# Patient Record
Sex: Female | Born: 1950
Health system: Southern US, Community
[De-identification: ages and names within clinical notes are randomized; demographics above are authoritative.]

## PROBLEM LIST (undated history)

## (undated) DIAGNOSIS — Z8719 Personal history of other diseases of the digestive system: Secondary | ICD-10-CM

## (undated) DIAGNOSIS — K5790 Diverticulosis of intestine, part unspecified, without perforation or abscess without bleeding: Secondary | ICD-10-CM

## (undated) DIAGNOSIS — I10 Essential (primary) hypertension: Secondary | ICD-10-CM

## (undated) DIAGNOSIS — M1990 Unspecified osteoarthritis, unspecified site: Secondary | ICD-10-CM

## (undated) DIAGNOSIS — R0602 Shortness of breath: Secondary | ICD-10-CM

## (undated) HISTORY — PX: TONSILLECTOMY: SUR1361

## (undated) HISTORY — PX: HERNIA REPAIR: SHX51

## (undated) HISTORY — PX: JOINT REPLACEMENT: SHX530

## (undated) HISTORY — PX: TONSILLECTOMY: SHX5217

## (undated) HISTORY — PX: MOUTH SURGERY: SHX715

## (undated) HISTORY — DX: Personal history of other diseases of the digestive system: Z87.19

## (undated) HISTORY — DX: Diverticulosis of intestine, part unspecified, without perforation or abscess without bleeding: K57.90

## (undated) HISTORY — DX: Essential (primary) hypertension: I10

---
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
## 2001-09-16 ENCOUNTER — Other Ambulatory Visit: Admission: RE | Admit: 2001-09-16 | Discharge: 2001-09-16 | Payer: Self-pay | Admitting: Obstetrics and Gynecology

## 2002-09-23 ENCOUNTER — Other Ambulatory Visit: Admission: RE | Admit: 2002-09-23 | Discharge: 2002-09-23 | Payer: Self-pay | Admitting: Obstetrics and Gynecology

## 2008-04-17 ENCOUNTER — Emergency Department (HOSPITAL_COMMUNITY): Admission: EM | Admit: 2008-04-17 | Discharge: 2008-04-17 | Payer: Self-pay | Admitting: Family Medicine

## 2008-04-20 ENCOUNTER — Emergency Department (HOSPITAL_COMMUNITY): Admission: EM | Admit: 2008-04-20 | Discharge: 2008-04-20 | Payer: Self-pay | Admitting: Emergency Medicine

## 2008-04-20 ENCOUNTER — Encounter: Admission: RE | Admit: 2008-04-20 | Discharge: 2008-04-20 | Payer: Self-pay | Admitting: Emergency Medicine

## 2010-04-26 LAB — POCT URINALYSIS DIP (DEVICE)
Bilirubin Urine: NEGATIVE
Ketones, ur: NEGATIVE mg/dL
Protein, ur: NEGATIVE mg/dL
Specific Gravity, Urine: 1.01 (ref 1.005–1.030)

## 2010-05-17 ENCOUNTER — Inpatient Hospital Stay (INDEPENDENT_AMBULATORY_CARE_PROVIDER_SITE_OTHER)
Admission: RE | Admit: 2010-05-17 | Discharge: 2010-05-17 | Disposition: A | Discharge status: Home / Self Care | Payer: BC Managed Care – PPO | Source: Ambulatory Visit | Attending: Family Medicine | Admitting: Family Medicine

## 2010-05-17 DIAGNOSIS — L0291 Cutaneous abscess, unspecified: Secondary | ICD-10-CM

## 2010-05-20 ENCOUNTER — Inpatient Hospital Stay (INDEPENDENT_AMBULATORY_CARE_PROVIDER_SITE_OTHER)
Admission: RE | Admit: 2010-05-20 | Discharge: 2010-05-20 | Disposition: A | Discharge status: Home / Self Care | Payer: BC Managed Care – PPO | Source: Ambulatory Visit | Attending: Family Medicine | Admitting: Family Medicine

## 2010-05-20 DIAGNOSIS — L0291 Cutaneous abscess, unspecified: Secondary | ICD-10-CM

## 2010-05-20 DIAGNOSIS — L0390 Cellulitis, unspecified: Secondary | ICD-10-CM

## 2012-04-21 ENCOUNTER — Emergency Department (HOSPITAL_COMMUNITY)
Admission: EM | Admit: 2012-04-21 | Discharge: 2012-04-21 | Disposition: A | Discharge status: Home / Self Care | Payer: BC Managed Care – PPO | Source: Home / Self Care | Attending: Emergency Medicine | Admitting: Emergency Medicine

## 2012-04-21 ENCOUNTER — Encounter (HOSPITAL_COMMUNITY): Payer: Self-pay | Admitting: Emergency Medicine

## 2012-04-21 DIAGNOSIS — N939 Abnormal uterine and vaginal bleeding, unspecified: Secondary | ICD-10-CM

## 2012-04-21 DIAGNOSIS — R030 Elevated blood-pressure reading, without diagnosis of hypertension: Secondary | ICD-10-CM

## 2012-04-21 DIAGNOSIS — N926 Irregular menstruation, unspecified: Secondary | ICD-10-CM

## 2012-04-21 LAB — POCT URINALYSIS DIP (DEVICE)
Bilirubin Urine: NEGATIVE
Glucose, UA: NEGATIVE mg/dL
Ketones, ur: NEGATIVE mg/dL
Specific Gravity, Urine: 1.015 (ref 1.005–1.030)
Urobilinogen, UA: 0.2 mg/dL (ref 0.0–1.0)

## 2012-04-21 NOTE — ED Notes (Signed)
Pt c/o vaginal bleeding on and off for the past 3 weeks. Denies discharge, fever, or pelvic pain. Mild abdominal cramping. Has tried Tylenol extra strength with no relief. Has to wear a pad due to heavy bleeding. Patient is alert and oriented.

## 2012-04-21 NOTE — ED Provider Notes (Signed)
Medical screening examination/treatment/procedure(s) were performed by non-physician practitioner and as supervising physician I was immediately available for consultation/collaboration.  Cassie Nelson   Cornella Emmer, MD 04/21/12 1248 

## 2012-04-21 NOTE — ED Provider Notes (Signed)
History     CSN: [id number]  Arrival date & time 04/21/12  1027   First MD Initiated Contact with Patient 04/21/12 1228      Chief Complaint  Patient presents with  . Vaginal Bleeding    (Consider location/radiation/quality/duration/timing/severity/associated sxs/prior treatment) HPI Comments: Pt went through menopause 10 years ago, no vaginal bleeding since until 3 weeks ago.  Bleeding is on and off, sometimes spotting, sometimes bright red bleeding that is heavier.    Patient is a 62 y.o. female presenting with vaginal bleeding. The history is provided by the patient.  Vaginal Bleeding This is a new problem. Episode onset: 3 weeks ago. The problem occurs daily. The problem has not changed since onset.Associated symptoms include abdominal pain. Associated symptoms comments: Mild low abd cramping at times. Nothing aggravates the symptoms. Nothing relieves the symptoms. She has tried nothing for the symptoms.    History reviewed. No pertinent past medical history.  History reviewed. No pertinent past surgical history.  History reviewed. No pertinent family history.  History  Substance Use Topics  . Smoking status: Former Smoker    Quit date: 06/22/2010  . Smokeless tobacco: Not on file  . Alcohol Use: Yes     Comment: occasionally    OB History   Grav Para Term Preterm Abortions TAB SAB Ect Mult Living                  Review of Systems  Constitutional: Negative for fever and chills.  Gastrointestinal: Positive for abdominal pain. Negative for nausea and vomiting.       Mild low abd cramping at times  Genitourinary: Positive for vaginal bleeding. Negative for dysuria, frequency, flank pain, vaginal discharge and vaginal pain.    Allergies  Review of patient's allergies indicates no known allergies.  Home Medications  No current outpatient prescriptions on file.  BP 188/62  Pulse 91  Temp(Src) 98 F (36.7 C) (Oral)  SpO2 95%  Physical Exam  Constitutional:  She appears well-developed and well-nourished. No distress.  Morbid obesity  Cardiovascular: Normal rate and regular rhythm.   Pulmonary/Chest: Effort normal and breath sounds normal.  Abdominal: Soft. Bowel sounds are normal. She exhibits no distension. There is no tenderness. There is no rebound, no guarding and no CVA tenderness.  Genitourinary: Cervix exhibits no motion tenderness and no friability.    Unable to palpate uterus or ovaries due to body habitus.     ED Course  Procedures (including critical care time)  Labs Reviewed  POCT URINALYSIS DIP (DEVICE) - Abnormal; Notable for the following:    Hgb urine dipstick LARGE (*)    Leukocytes, UA TRACE (*)    All other components within normal limits   No results found.   1. Abnormal uterine bleeding   2. Elevated blood pressure reading without diagnosis of hypertension       MDM  Dr. Ladon Applebaum examined pt with me.  Abnormal uterine bleeding in post-menopausal woman.  Referred to Dr. Erin Fulling (Gyn) for further eval.  Also elevated bp today. Pt to find pcp and f/u about bp. In the meantime pt is to measure and record bp on her own.          Cathlyn Parsons, NP 04/21/12 1240

## 2012-05-14 ENCOUNTER — Other Ambulatory Visit (HOSPITAL_COMMUNITY)
Admission: RE | Admit: 2012-05-14 | Discharge: 2012-05-14 | Disposition: A | Discharge status: Home / Self Care | Payer: BC Managed Care – PPO | Source: Ambulatory Visit | Attending: Obstetrics & Gynecology | Admitting: Obstetrics & Gynecology

## 2012-05-14 ENCOUNTER — Encounter: Payer: Self-pay | Admitting: Obstetrics & Gynecology

## 2012-05-14 ENCOUNTER — Ambulatory Visit (INDEPENDENT_AMBULATORY_CARE_PROVIDER_SITE_OTHER): Payer: BC Managed Care – PPO | Admitting: Obstetrics & Gynecology

## 2012-05-14 VITALS — BP 191/83 | HR 90 | Temp 97.3°F | Ht 68.0 in | Wt 323.3 lb

## 2012-05-14 DIAGNOSIS — N950 Postmenopausal bleeding: Secondary | ICD-10-CM | POA: Insufficient documentation

## 2012-05-14 DIAGNOSIS — I10 Essential (primary) hypertension: Secondary | ICD-10-CM | POA: Insufficient documentation

## 2012-05-14 DIAGNOSIS — R87619 Unspecified abnormal cytological findings in specimens from cervix uteri: Secondary | ICD-10-CM | POA: Insufficient documentation

## 2012-05-14 DIAGNOSIS — N888 Other specified noninflammatory disorders of cervix uteri: Secondary | ICD-10-CM | POA: Insufficient documentation

## 2012-05-14 MED ORDER — LISINOPRIL-HYDROCHLOROTHIAZIDE 20-12.5 MG PO TABS: 1.0000 | ORAL_TABLET | Freq: Every day | ORAL | Status: DC

## 2012-05-14 NOTE — Progress Notes (Signed)
Subjective:     Patient ID: Cassie Nelson, female   DOB: 11-22-1950, 62 y.o.   MRN: [id number]  HPI Pt c/o heavy bleeding 1 months ago for 4 weeks.  Her LMP was 10 years prev.  She has had no bleeding now for ~3weeks.  She denies pain.   She was previously told that she had elevated BP but, has never been treated and has not seen a primary care physician in 'many years'  Last PAP >5 years previously.  No currently sexually active   Past Medical History  Diagnosis Date  . Diverticular disease    Past Surgical History  Procedure Laterality Date  . Tonsillectomy    . Hernia repair     No current outpatient prescriptions on file prior to visit.   No current facility-administered medications on file prior to visit.  No Known Allergies History   Social History  . Marital Status: Unknown    Spouse Name: N/A    Number of Children: N/A  . Years of Education: N/A   Occupational History  . Not on file.   Social History Main Topics  . Smoking status: Former Smoker    Quit date: 06/22/2010  . Smokeless tobacco: Not on file  . Alcohol Use: Yes     Comment: occasionally  . Drug Use: No  . Sexually Active: Not Currently   Other Topics Concern  . Not on file   Social History Narrative  . No narrative on file   Family History  Problem Relation Age of Onset  . Heart disease Father   . Cancer Father        Review of Systems     Objective:   Physical Exam BP 191/83  Pulse 90  Temp(Src) 97.3 F (36.3 C) (Oral)  Ht 5\' 8"  (1.727 m)  Wt 323 lb 4.8 oz (146.648 kg)  BMI 49.17 kg/m2 Pt in NAD.  Teary at times during exam Lungs: CTA CV: RRR Abd: obese, NT, ND GU: EGBUS: no lesions Vagina: no blood in vault Cervix: no lesion; no mucopurulent d/c Uterus: difficult to assess due to body habitus The indications for endometrial biopsy were reviewed.   Risks of the biopsy including cramping, bleeding, infection, uterine perforation, inadequate specimen and need for additional  procedures  were discussed. The patient states she understands and agrees to undergo procedure today. Consent was signed. Time out was performed. Urine HCG was negative. A sterile speculum was placed in the patient's vagina and the cervix was prepped with Betadine. A single-toothed tenaculum was placed on the anterior lip of the cervix to stabilize it. The 3 mm pipelle was introduced into the endometrial cavity without difficulty to a depth of 9cm, and a moderate amount of tissue was obtained and sent to pathology. The instruments were removed from the patient's vagina. Minimal bleeding from the cervix was noted. The patient tolerated the procedure well. Routine post-procedure instructions were given to the patient. The patient will follow up to review the results and for further management.   Adnexa: no masses difficult to palpate due to body habitus         Assessment:     PMPB- need to r/o endometrial ca Elevated BP- reviewed with pt the risks of stroke and the IMPORTANCE of getting further tx      Plan:     Zestoretic 20/12.5 po q day F/u primary care- referral given F/u surg path Pelvic sono F/u PAP

## 2012-05-14 NOTE — Patient Instructions (Addendum)
Postmenopausal Bleeding Menopause is commonly referred to as the "change in life." It is a time when the fertile years, the time of ovulating and having menstrual periods, has come to an end. It is also determined by not having menstrual periods for 12 months.  Postmenopausal bleeding is any bleeding a woman has after she has entered into menopause. Any type of postmenopausal bleeding, even if it appears to be a typical menstrual period, is concerning. This should be evaluated by your caregiver.  CAUSES   Hormone therapy.  Cancer of the cervix or cancer of the lining of the uterus (endometrial cancer).  Thinning of the uterine lining (uterine atrophy).  Thyroid diseases.  Certain medicines.  Infection of the uterus or cervix.  Inflammation or irritation of the uterine lining (endometritis).  Estrogen-secreting tumors.  Growths (polyps) on the cervix, uterine lining, or uterus.  Uterine tumors (fibroids).  Being very overweight (obese). DIAGNOSIS  Your caregiver will take a medical history and ask questions. A physical exam will also be performed. Further tests may include:   A transvaginal ultrasound. An ultrasound wand or probe is inserted into your vagina to view the pelvic organs.  A biopsy of the lining of the uterus (endometrium). A sample of the endometrium is removed and examined.  A hysteroscopy. Your caregiver may use an instrument with a light and a camera attached to it (hysteroscope). The hysteroscope is used to look inside the uterus for problems.  A dilation and curettage (D&C). Tissue is removed from the uterine lining to be examined for problems. TREATMENT  Treatment depends on the cause of the bleeding. Some treatments include:   Surgery.  Medicines.  Hormones.  A hysteroscopy or D&C to remove polyps or fibroids.  Changing or stopping a current medicine you are taking. Talk to your caregiver about your specific treatment. HOME CARE INSTRUCTIONS    Maintain a healthy weight.  Keep regular pelvic exams and Pap tests. SEEK MEDICAL CARE IF:   You have bleeding, even if it is light in comparison to your previous periods.  Your bleeding lasts more than 1 week.  You have abdominal pain.  You develop bleeding with sexual intercourse. SEEK IMMEDIATE MEDICAL CARE IF:   You have a fever, chills, headache, dizziness, muscle aches, and bleeding.  You have severe pain with bleeding.  You are passing blood clots.  You have bleeding and need more than 1 pad an hour.  You feel faint. MAKE SURE YOU:  Understand these instructions.  Will watch your condition.  Will get help right away if you are not doing well or get worse. Document Released: 04/11/2005 Document Revised: 03/26/2011 Document Reviewed: 09/07/2010 [facility] Patient Information 2013 [place], [place]. Hypertension As your heart beats, it forces blood through your arteries. This force is your blood pressure. If the pressure is too high, it is called hypertension (HTN) or high blood pressure. HTN is dangerous because you may have it and not know it. High blood pressure may mean that your heart has to work harder to pump blood. Your arteries may be narrow or stiff. The extra work puts you at risk for heart disease, stroke, and other problems.  Blood pressure consists of two numbers, a higher number over a lower, 110/72, for example. It is stated as "110 over 72." The ideal is below 120 for the top number (systolic) and under 80 for the bottom (diastolic). Write down your blood pressure today. You should pay close attention to your blood pressure if you have  certain conditions such as:  Heart failure.  Prior heart attack.  Diabetes  Chronic kidney disease.  Prior stroke.  Multiple risk factors for heart disease. To see if you have HTN, your blood pressure should be measured while you are seated with your arm held at the level of the heart. It should be measured at least  twice. A one-time elevated blood pressure reading (especially in the Emergency Department) does not mean that you need treatment. There may be conditions in which the blood pressure is different between your right and left arms. It is important to see your caregiver soon for a recheck. Most people have essential hypertension which means that there is not a specific cause. This type of high blood pressure may be lowered by changing lifestyle factors such as:  Stress.  Smoking.  Lack of exercise.  Excessive weight.  Drug/tobacco/alcohol use.  Eating less salt. Most people do not have symptoms from high blood pressure until it has caused damage to the body. Effective treatment can often prevent, delay or reduce that damage. TREATMENT  When a cause has been identified, treatment for high blood pressure is directed at the cause. There are a large number of medications to treat HTN. These fall into several categories, and your caregiver will help you select the medicines that are best for you. Medications may have side effects. You should review side effects with your caregiver. If your blood pressure stays high after you have made lifestyle changes or started on medicines,   Your medication(s) may need to be changed.  Other problems may need to be addressed.  Be certain you understand your prescriptions, and know how and when to take your medicine.  Be sure to follow up with your caregiver within the time frame advised (usually within two weeks) to have your blood pressure rechecked and to review your medications.  If you are taking more than one medicine to lower your blood pressure, make sure you know how and at what times they should be taken. Taking two medicines at the same time can result in blood pressure that is too low. SEEK IMMEDIATE MEDICAL CARE IF:  You develop a severe headache, blurred or changing vision, or confusion.  You have unusual weakness or numbness, or a faint  feeling.  You have severe chest or abdominal pain, vomiting, or breathing problems. MAKE SURE YOU:   Understand these instructions.  Will watch your condition.  Will get help right away if you are not doing well or get worse. Document Released: 01/01/2005 Document Revised: 03/26/2011 Document Reviewed: 08/22/2007 [facility] Patient Information 2013 [place], [place].

## 2012-05-14 NOTE — Progress Notes (Signed)
Started having spotting & bleeding and after a couple weeks bleeding got heavier- so went to urgent care. Bleeding started mid March, lasting 4 weeks and stopped 2 weeks ago. Stopped menstruation 10 years ago.

## 2012-05-21 ENCOUNTER — Telehealth: Payer: Self-pay

## 2012-05-21 NOTE — Telephone Encounter (Signed)
Message copied by Faythe Casa on Wed May 21, 2012  1:58 PM ------      Message from: Willodean Rosenthal      Created: Wed May 21, 2012  1:42 PM       Please notify pt of normal PAP and Endobx.              clh-S ------

## 2012-05-21 NOTE — Telephone Encounter (Signed)
Called pt and I just informed pt that her pap and endo bx are normal.  Pt asked if she still needed to have [place] and come to appt.  I advised pt to go to [place] and to the appt so that her and the provider can discuss further options for her postmenopausal bleeding.  Pt stated understanding and did not have any other questions.

## 2012-05-22 ENCOUNTER — Ambulatory Visit (INDEPENDENT_AMBULATORY_CARE_PROVIDER_SITE_OTHER): Payer: BC Managed Care – PPO | Admitting: Family Medicine

## 2012-05-22 ENCOUNTER — Encounter: Payer: Self-pay | Admitting: Family Medicine

## 2012-05-22 VITALS — BP 140/70 | HR 72 | Temp 98.7°F | Ht 68.0 in | Wt 320.8 lb

## 2012-05-22 DIAGNOSIS — I10 Essential (primary) hypertension: Secondary | ICD-10-CM

## 2012-05-22 DIAGNOSIS — Z23 Encounter for immunization: Secondary | ICD-10-CM

## 2012-05-22 DIAGNOSIS — N393 Stress incontinence (female) (male): Secondary | ICD-10-CM

## 2012-05-22 LAB — POCT URINALYSIS DIPSTICK
Bilirubin, UA: NEGATIVE
Glucose, UA: NEGATIVE
Ketones, UA: NEGATIVE
Leukocytes, UA: NEGATIVE
Nitrite, UA: NEGATIVE
Protein, UA: NEGATIVE
Spec Grav, UA: 1.02
Urobilinogen, UA: NEGATIVE

## 2012-05-22 LAB — BASIC METABOLIC PANEL
BUN: 17 mg/dL (ref 6–23)
CO2: 23 mEq/L (ref 19–32)
Chloride: 106 mEq/L (ref 96–112)
Glucose, Bld: 94 mg/dL (ref 70–99)
Potassium: 3.8 mEq/L (ref 3.5–5.1)

## 2012-05-22 NOTE — Progress Notes (Signed)
  Subjective:    Patient ID: Cassie Nelson, female    DOB: [birth year], 62 y.o.   MRN: [id number]  HPI 62 year old female presents to establish. She was referred by her GYN for assessment and treatment of HTN. Has not has PCP in over 20 years. GYN is Dr. Burnice Logan Katrinka Blazing. At last OV pap and endometrial biopsy 4/30.  She has been told at Urgent Care visits that BPs have been 130-140 systolic at time over past few years.. She never followed up abpout it.  In last 6 weeks went to see GYN for postmenopausal bleeding.. Blood pressure was 191/83. Not checking at home. No headaches, no blurred vision, no neuro changes.  Occ sweating excessively.  At GYN visit last week she was started on lisinopril/HCTZ 20/12.5 mg  one tablet daily. No SE to medication.. Using medication without problems or lightheadedness:  None Chest pain with exertion:None Edema:None Short of breath:None Average home BPs: not checking. Other issues: Exercising minimally Diet:Poor       Review of Systems  Constitutional: Negative for fever, fatigue and unexpected weight change.  HENT: Negative for ear pain, congestion, sore throat, sneezing, trouble swallowing and sinus pressure.   Eyes: Negative for pain and itching.  Respiratory: Negative for cough, shortness of breath and wheezing.   Cardiovascular: Negative for chest pain, palpitations and leg swelling.  Gastrointestinal: Negative for nausea, abdominal pain, diarrhea, constipation and blood in stool.  Genitourinary: Negative for dysuria, hematuria, vaginal discharge, difficulty urinating and menstrual problem.  Skin: Negative for rash.  Neurological: Negative for syncope, weakness, light-headedness, numbness and headaches.  Psychiatric/Behavioral: Negative for confusion and dysphoric mood. The patient is not nervous/anxious.        Objective:   Physical Exam  Constitutional: Vital signs are normal. She appears well-developed and well-nourished. She is  cooperative.  Non-toxic appearance. She does not appear ill. No distress.  HENT:  Head: Normocephalic.  Right Ear: Hearing, tympanic membrane, external ear and ear canal normal. Tympanic membrane is not erythematous, not retracted and not bulging.  Left Ear: Hearing, tympanic membrane, external ear and ear canal normal. Tympanic membrane is not erythematous, not retracted and not bulging.  Nose: No mucosal edema or rhinorrhea. Right sinus exhibits no maxillary sinus tenderness and no frontal sinus tenderness. Left sinus exhibits no maxillary sinus tenderness and no frontal sinus tenderness.  Mouth/Throat: Uvula is midline, oropharynx is clear and moist and mucous membranes are normal.  Eyes: Conjunctivae, EOM and lids are normal. Pupils are equal, round, and reactive to light. No foreign bodies found.  Neck: Trachea normal and normal range of motion. Neck supple. Carotid bruit is not present. No mass and no thyromegaly present.  Cardiovascular: Normal rate, regular rhythm, S1 normal, S2 normal, normal heart sounds, intact distal pulses and normal pulses.  Exam reveals no gallop and no friction rub.   No murmur heard. Pulmonary/Chest: Effort normal and breath sounds normal. Not tachypneic. No respiratory distress. She has no decreased breath sounds. She has no wheezes. She has no rhonchi. She has no rales.  Abdominal: Soft. Normal appearance and bowel sounds are normal. There is no tenderness.  Neurological: She is alert.  Skin: Skin is warm, dry and intact. No rash noted.  Psychiatric: Her speech is normal and behavior is normal. Judgment and thought content normal. Her mood appears not anxious. Cognition and memory are normal. She does not exhibit a depressed mood.          Assessment & Plan:

## 2012-05-22 NOTE — Patient Instructions (Addendum)
Get BP cuff and checking BP daily to every few days. Call in 1 week with BP daily measurements.. If above goal >140/90  More than 3 measurements we will plan to increase the medication. Look into shingles vaccine. Given TDAP today. Schedule CPX/ BP check  in 3-4 weeks with fasting labs prior. Stop at front desk today to have labs drawn as well. TODAY ONLY BMET. Work on [employer] , exercise and regular exercise (3-5 times a week).

## 2012-05-22 NOTE — Assessment & Plan Note (Addendum)
Improved control on lisinopril HCTZ started at GYN. Great choice of medication. Pt without SE. Will check Cretinine since it has been 7 days after starting.  Will eval for secondary causes of , end organ damage from BP and risk factors associated. UA today showed no protein.  EKG  NSR, no ST changes, no Q no LVH.  Follow BPs at home.. May need to increase to 2 tabs daily if above goal.  Follow up for CPX in 3-4 weeks, labs prior.

## 2012-05-23 ENCOUNTER — Ambulatory Visit (HOSPITAL_COMMUNITY)
Admission: RE | Admit: 2012-05-23 | Discharge: 2012-05-23 | Disposition: A | Discharge status: Home / Self Care | Payer: BC Managed Care – PPO | Source: Ambulatory Visit | Attending: Obstetrics & Gynecology | Admitting: Obstetrics & Gynecology

## 2012-05-23 DIAGNOSIS — N950 Postmenopausal bleeding: Secondary | ICD-10-CM

## 2012-05-23 DIAGNOSIS — R9389 Abnormal findings on diagnostic imaging of other specified body structures: Secondary | ICD-10-CM | POA: Insufficient documentation

## 2012-05-26 ENCOUNTER — Encounter: Payer: Self-pay | Admitting: Obstetrics & Gynecology

## 2012-05-26 ENCOUNTER — Ambulatory Visit (INDEPENDENT_AMBULATORY_CARE_PROVIDER_SITE_OTHER): Payer: BC Managed Care – PPO | Admitting: Obstetrics & Gynecology

## 2012-05-26 VITALS — BP 165/85 | HR 83 | Temp 98.2°F | Ht 68.0 in | Wt 316.7 lb

## 2012-05-26 DIAGNOSIS — N950 Postmenopausal bleeding: Secondary | ICD-10-CM

## 2012-05-26 NOTE — Progress Notes (Signed)
Subjective:     Patient ID: Cassie Nelson, female   DOB: February 17, 1950, 62 y.o.   MRN: [id number]  HPI Pt with h/o PMPB presents for f/u of sono and bx.  She had had no further bleeding since her last visit.  She was seen by primary care.  Currently on BP meds and BP under better control.     Review of Systems     Objective:   Physical Exam BP 165/85  Pulse 83  Temp(Src) 98.2 F (36.8 C) (Oral)  Ht 5\' 8"  (1.727 m)  Wt 316 lb 11.2 oz (143.654 kg)  BMI 48.17 kg/m2  05/14/2012 Surg Path Diagnosis Endometrium, biopsy - ATROPHIC APPEARING ENDOMETRIUM. - THERE IS NO EVIDENCE OF HYPERPLASIA OR MALIGNANCY.    4//30/2014 PAP: WNL with negative HPV  05/23/2012 Findings:  Uterus: 8.4 x 3.6 x 5.0 cm. No fibroids identified.  Endometrium: Diffuse endometrial thickening with tiny internal  cystic foci. Double layer endometrial thickness measures 18 mm  transvaginally. No focal mass lesion visualized.These findings are  highly suspicious for cystic endometrial hyperplasia, although  carcinoma cannot definitely be excluded.  Right ovary: 2.4 x 2.1 x 1.9 cm. Normal appearance. No adnexal  mass identified.  Left ovary: 2.5 x 1.6 x 1.9 cm. Normal appearance. No adnexal  mass identified.  Other Findings: No free fluid  IMPRESSION:  1. Abnormal endometrial thickness of 18 mm, with diffuse internal  cystic foci noted. These findings strongly suggests cystic  endometrial hyperplasia, however carcinoma cannot definitely be  excluded. In the setting of post-menopausal bleeding, endometrial  sampling is indicated to exclude carcinoma. If results are benign,  sonohysterogram should be considered for focal lesion work-up.  (Ref: Radiological Reasoning: Algorithmic Workup of Abnormal  Vaginal Bleeding with Endovaginal Sonography and Sonohysterography.  AJR 2008; [id number]:[id number])  2. Normal appearance of both ovaries. No adnexal mass identified.   Assessment:     PMPB in pt with thickened endometrium.   D/w pt options for eval of thickened endometrium rec hysteroscopy to evaluate      Plan:     Patient desires surgical management with diagnostic endometrium.  The risks of surgery were discussed in detail with the patient including but not limited to: perforation; bleeding which may require transfusion or reoperation; infection which may require prolonged hospitalization or re-hospitalization and antibiotic therapy; injury to bowel, bladder, ureters and major vessels or other surrounding organs; need for additional procedures including laparotomy; thromboembolic phenomenon, incisional problems and other postoperative or anesthesia complications. The patient also understands the alternative treatment options which were discussed in full. All questions were answered.  She was told that she will be contacted by our surgical scheduler regarding the time and date of her surgery; routine preoperative instructions of having nothing to eat or drink after midnight on the day prior to surgery and also coming to the hospital 1 1/2 hours prior to her time of surgery were also emphasized.  She was told she may be called for a preoperative appointment about a week prior to surgery and will be given further preoperative instructions at that visit. Printed patient education handouts about the procedure were given to the patient to review at home.

## 2012-05-26 NOTE — Patient Instructions (Signed)
Hysteroscopy  Hysteroscopy is a procedure used for looking inside the womb (uterus). It may be done for many different reasons, including:  · To evaluate abnormal bleeding, fibroid (benign, noncancerous) tumors, polyps, scar tissue (adhesions), and possibly cancer of the uterus.  · To look for lumps (tumors) and other uterine growths.  · To look for causes of why a woman cannot get pregnant (infertility), causes of recurrent loss of pregnancy (miscarriages), or a lost intrauterine device (IUD).  · To perform a sterilization by blocking the fallopian tubes from inside the uterus.  A hysteroscopy should be done right after a menstrual period to be sure you are not pregnant.  LET YOUR CAREGIVER KNOW ABOUT:   · Allergies.  · Medicines taken, including herbs, eyedrops, over-the-counter medicines, and creams.  · Use of steroids (by mouth or creams).  · Previous problems with anesthetics or numbing medicines.  · History of bleeding or blood problems.  · History of blood clots.  · Possibility of pregnancy, if this applies.  · Previous surgery.  · Other health problems.  RISKS AND COMPLICATIONS   · Putting a hole in the uterus.  · Excessive bleeding.  · Infection.  · Damage to the cervix.  · Injury to other organs.  · Allergic reaction to medicines.  · Too much fluid used in the uterus for the procedure.  BEFORE THE PROCEDURE   · Do not take aspirin or blood thinners for a week before the procedure, or as directed. It can cause bleeding.  · Arrive at least 60 minutes before the procedure or as directed to read and sign the necessary forms.  · Arrange for someone to take you home after the procedure.  · If you smoke, do not smoke for 2 weeks before the procedure.  PROCEDURE   · Your caregiver may give you medicine to relax you. He or she may also give you a medicine that numbs the area around the cervix (local anesthetic) or a medicine that makes you sleep (general anesthesia).  · Sometimes, a medicine is placed in the cervix  the day before the procedure. This medicine makes the cervix have a larger opening (dilate). This makes it easier for the instrument to be inserted into the uterus.  · A small instrument (hysteroscope) is inserted through the vagina into the uterus. This instrument is similar to a pencil-sized telescope with a light.  · During the procedure, air or a liquid is put into the uterus, which allows the surgeon to see better.  · Sometimes, tissue is gently scraped from inside the uterus. These tissue samples are sent to a specialist who looks at tissue samples (pathologist). The pathologist will give a report to your caregiver. This will help your caregiver decide if further treatment is necessary. The report will also help your caregiver decide on the best treatment if the test comes back abnormal.  AFTER THE PROCEDURE   · If you had a general anesthetic, you may be groggy for a couple hours after the procedure.  · If you had a local anesthetic, you will be advised to rest at the surgical center or caregiver's office until you are stable and feel ready to go home.  · You may have some cramping for a couple days.  · You may have bleeding, which varies from light spotting for a few days to menstrual-like bleeding for up to 3 to 7 days. This is normal.  · Have someone take you home.  FINDING OUT THE   RESULTS OF YOUR TEST  Not all test results are available during your visit. If your test results are not back during the visit, make an appointment with your caregiver to find out the results. Do not assume everything is normal if you have not heard from your caregiver or the medical facility. It is important for you to follow up on all of your test results.  HOME CARE INSTRUCTIONS   · Do not drive for 24 hours or as instructed.  · Only take over-the-counter or prescription medicines for pain, discomfort, or fever as directed by your caregiver.  · Do not take aspirin. It can cause or aggravate bleeding.  · Do not drive or drink  alcohol while taking pain medicine.  · You may resume your usual diet.  · Do not use tampons, douche, or have sexual intercourse for 2 weeks, or as advised by your caregiver.  · Rest and sleep for the first 24 to 48 hours.  · Take your temperature twice a day for 4 to 5 days. Write it down. Give these temperatures to your caregiver if they are abnormal (above 98.6° F or 37.0° C).  · Take medicines your caregiver has ordered as directed.  · Follow your caregiver's advice regarding diet, exercise, lifting, driving, and general activities.  · Take showers instead of baths for 2 weeks, or as recommended by your caregiver.  · If you develop constipation:  · Take a mild laxative with the advice of your caregiver.  · Eat bran foods.  · Drink enough water and fluids to keep your urine clear or pale yellow.  · Try to have someone with you or available to you for the first 24 to 48 hours, especially if you had a general anesthetic.  · Make sure you and your family understand everything about your operation and recovery.  · Follow your caregiver's advice regarding follow-up appointments and Pap smears.  SEEK MEDICAL CARE IF:   · You feel dizzy or lightheaded.  · You feel sick to your stomach (nauseous).  · You develop abnormal vaginal discharge.  · You develop a rash.  · You have an abnormal reaction or allergy to your medicine.  · You need stronger pain medicine.  SEEK IMMEDIATE MEDICAL CARE IF:   · Bleeding is heavier than a normal menstrual period or you have blood clots.  · You have an oral temperature above 102° F (38.9° C), not controlled by medicine.  · You have increasing cramps or pains not relieved with medicine.  · You develop belly (abdominal) pain that does not seem to be related to the same area of earlier cramping and pain.  · You pass out.  · You develop pain in the tops of your shoulders (shoulder strap areas).  · You develop shortness of breath.  MAKE SURE YOU:   · Understand these instructions.  · Will watch  your condition.  · Will get help right away if you are not doing well or get worse.  Document Released: 04/09/2000 Document Revised: 03/26/2011 Document Reviewed: 08/02/2008  Exit[employer]® Patient Information ©2013 [employer].

## 2012-05-30 ENCOUNTER — Telehealth: Payer: Self-pay

## 2012-05-30 DIAGNOSIS — I10 Essential (primary) hypertension: Secondary | ICD-10-CM

## 2012-05-30 NOTE — Telephone Encounter (Signed)
Pt seen 05/22/12; BP on 05/23/12 - 5:30 pm-128/104;  05/10 - 9:30 am - 130/67;   05/11 - 8am - 160/75 and 8:30 pm - 132/74; 05/12 - 1 pm 170/77 Pt seen at GYN office and stressed on 05/12.   05/13 - 8am -158/88;    05/14 - 8:30 am  -162/87; 05/15 - 10am -157/83 and 05/16 - 8:30 am -128/87.  Pt scheduled on 07/03/12 for hysteroscopy and if BP not under 140 will reschedule procedure. Pt not having h/a, dizziness, CP or SOB. [facility].

## 2012-05-30 NOTE — Telephone Encounter (Signed)
BP to high most of time..., increase lisinopril HCTZ to two tabs daily.. send in prescription/change med list please.  AHve her call with BPs in 1-2 weeks.

## 2012-06-02 MED ORDER — LISINOPRIL-HYDROCHLOROTHIAZIDE 20-12.5 MG PO TABS: 2.0000 | ORAL_TABLET | Freq: Every day | ORAL | Status: DC

## 2012-06-02 NOTE — Telephone Encounter (Signed)
Patient advised and rx sent to pharmacy  

## 2012-06-05 ENCOUNTER — Telehealth: Payer: Self-pay

## 2012-06-05 NOTE — Telephone Encounter (Signed)
Pt called to ck status on lisinopril hctz at [facility]; pharmacist said rx ready for pick up; advised pt and she will pick up today.

## 2012-06-13 ENCOUNTER — Telehealth: Payer: Self-pay

## 2012-06-13 NOTE — Telephone Encounter (Signed)
Pt has fasting lab scheduled on 06/16/12 and pt wants to know when fasting should start. Advised pt midnight on Sunday night; pt can have water of black coffee if needed prior to blood test. Pt voiced understanding.

## 2012-06-16 ENCOUNTER — Other Ambulatory Visit (INDEPENDENT_AMBULATORY_CARE_PROVIDER_SITE_OTHER): Payer: BC Managed Care – PPO

## 2012-06-16 DIAGNOSIS — I10 Essential (primary) hypertension: Secondary | ICD-10-CM

## 2012-06-16 LAB — CBC WITH DIFFERENTIAL/PLATELET
Eosinophils Absolute: 0.5 10*3/uL (ref 0.0–0.7)
Eosinophils Relative: 5.6 % — ABNORMAL HIGH (ref 0.0–5.0)
MCHC: 34.4 g/dL (ref 30.0–36.0)
MCV: 88.9 fl (ref 78.0–100.0)
Monocytes Absolute: 0.6 10*3/uL (ref 0.1–1.0)
Neutrophils Relative %: 58.6 % (ref 43.0–77.0)
Platelets: 283 10*3/uL (ref 150.0–400.0)
WBC: 8.6 10*3/uL (ref 4.5–10.5)

## 2012-06-16 LAB — COMPREHENSIVE METABOLIC PANEL
ALT: 27 U/L (ref 0–35)
AST: 24 U/L (ref 0–37)
Alkaline Phosphatase: 71 U/L (ref 39–117)
Glucose, Bld: 111 mg/dL — ABNORMAL HIGH (ref 70–99)
Sodium: 139 mEq/L (ref 135–145)
Total Bilirubin: 0.4 mg/dL (ref 0.3–1.2)
Total Protein: 8 g/dL (ref 6.0–8.3)

## 2012-06-16 LAB — LIPID PANEL
Cholesterol: 196 mg/dL (ref 0–200)
LDL Cholesterol: 123 mg/dL — ABNORMAL HIGH (ref 0–99)
VLDL: 37.8 mg/dL (ref 0.0–40.0)

## 2012-06-19 ENCOUNTER — Ambulatory Visit: Payer: BC Managed Care – PPO | Admitting: Obstetrics & Gynecology

## 2012-06-20 ENCOUNTER — Encounter: Payer: Self-pay | Admitting: Family Medicine

## 2012-06-20 ENCOUNTER — Encounter (HOSPITAL_COMMUNITY): Payer: Self-pay | Admitting: Pharmacy Technician

## 2012-06-20 ENCOUNTER — Ambulatory Visit (INDEPENDENT_AMBULATORY_CARE_PROVIDER_SITE_OTHER): Payer: BC Managed Care – PPO | Admitting: Family Medicine

## 2012-06-20 VITALS — BP 160/70 | HR 80 | Temp 98.9°F | Ht 68.0 in | Wt 317.0 lb

## 2012-06-20 DIAGNOSIS — R7309 Other abnormal glucose: Secondary | ICD-10-CM

## 2012-06-20 DIAGNOSIS — R7303 Prediabetes: Secondary | ICD-10-CM | POA: Insufficient documentation

## 2012-06-20 DIAGNOSIS — E786 Lipoprotein deficiency: Secondary | ICD-10-CM

## 2012-06-20 DIAGNOSIS — I10 Essential (primary) hypertension: Secondary | ICD-10-CM

## 2012-06-20 DIAGNOSIS — Z1212 Encounter for screening for malignant neoplasm of rectum: Secondary | ICD-10-CM

## 2012-06-20 DIAGNOSIS — E781 Pure hyperglyceridemia: Secondary | ICD-10-CM

## 2012-06-20 DIAGNOSIS — E7800 Pure hypercholesterolemia, unspecified: Secondary | ICD-10-CM | POA: Insufficient documentation

## 2012-06-20 MED ORDER — AMLODIPINE BESYLATE 5 MG PO TABS: 5.0000 mg | ORAL_TABLET | Freq: Every day | ORAL | Status: DC

## 2012-06-20 NOTE — Progress Notes (Signed)
Subjective:    Patient ID: Cassie Nelson, female    DOB: 02-15-1950, 62 y.o.   MRN: [id number]  HPI  62 year old female presents for 1 month follow up of HTN as well as review of prevention.  She goes to GYN for pap, breast, mammogram and is uptodate.  Hypertension: Inadequate control today on lisinopril HCTZ 2 tabs daily. No clear secondary cause. Using medication without problems or lightheadedness: None Chest pain with exertion:None Edema:None Short of breath:None Average home BPs: 1135-169/63-70 Other issues:  New diagnosis prediabetes. Minimal exercise.  Elevated triglycerides, LDL at goal <130, low HDL. On fish oil. Lab Results  Component Value Date   CHOL 196 06/16/2012   HDL 35.30* 06/16/2012   LDLCALC 123* 06/16/2012   TRIG 189.0* 06/16/2012   CHOLHDL 6 06/16/2012     Review of Systems  Constitutional: Negative for fever and fatigue.  HENT: Negative for ear pain.   Eyes: Negative for pain.  Respiratory: Negative for chest tightness and shortness of breath.   Cardiovascular: Negative for chest pain, palpitations and leg swelling.  Gastrointestinal: Negative for abdominal pain.  Genitourinary: Negative for dysuria.       Objective:   Physical Exam  Constitutional: Vital signs are normal. She appears well-developed and well-nourished. She is cooperative.  Non-toxic appearance. She does not appear ill. No distress.  HENT:  Head: Normocephalic.  Right Ear: Hearing, tympanic membrane, external ear and ear canal normal. Tympanic membrane is not erythematous, not retracted and not bulging.  Left Ear: Hearing, tympanic membrane, external ear and ear canal normal. Tympanic membrane is not erythematous, not retracted and not bulging.  Nose: No mucosal edema or rhinorrhea. Right sinus exhibits no maxillary sinus tenderness and no frontal sinus tenderness. Left sinus exhibits no maxillary sinus tenderness and no frontal sinus tenderness.  Mouth/Throat: Uvula is midline, oropharynx  is clear and moist and mucous membranes are normal.  Eyes: Conjunctivae, EOM and lids are normal. Pupils are equal, round, and reactive to light. No foreign bodies found.  Neck: Trachea normal and normal range of motion. Neck supple. Carotid bruit is not present. No mass and no thyromegaly present.  Cardiovascular: Normal rate, regular rhythm, S1 normal, S2 normal, normal heart sounds, intact distal pulses and normal pulses.  Exam reveals no gallop and no friction rub.   No murmur heard. Pulmonary/Chest: Effort normal and breath sounds normal. Not tachypneic. No respiratory distress. She has no decreased breath sounds. She has no wheezes. She has no rhonchi. She has no rales.  Abdominal: Soft. Normal appearance and bowel sounds are normal. There is no tenderness.  Neurological: She is alert.  Skin: Skin is warm, dry and intact. No rash noted.  Skin tag under right eye, 2 SKs on abdomen: pt reassured  Psychiatric: Her speech is normal and behavior is normal. Judgment and thought content normal. Her mood appears not anxious. Cognition and memory are normal. She does not exhibit a depressed mood.          Assessment & Plan:  The patient's preventative maintenance and recommended screening tests for an annual wellness exam were reviewed in full today. Brought up to date unless services declined.  Counselled on the importance of diet, exercise, and its role in overall health and mortality. The patient's FH and SH was reviewed, including their home life, tobacco status, and drug and alcohol status.   Vaccines: Uptodate with Td, due for shingles vaccine Mammo/pap/dve/breast: per GYN Colon: No family history of colon cancer known.  Plan ifob for now.

## 2012-06-20 NOTE — Assessment & Plan Note (Signed)
Encouraged exercise, weight loss, healthy eating habits. ? ?

## 2012-06-20 NOTE — Assessment & Plan Note (Signed)
New diagnosis . Increase fish oil to 200 mg divided daily.  Encouraged exercise, weight loss, healthy eating habits.

## 2012-06-20 NOTE — Patient Instructions (Addendum)
Add low dose amlodipine to regimen. Follow BP at home.. Call in 1-2 weeks with BP measurements. Work on low carb, low cholesterol diet. Increase fish oil to EPA/DHA 2000 mg divided daily. Look into shingles vaccine.. Schedule nurse visit for vaccine if you are interested. Stop at lab on your way out for stool test. Follow up HTN in 6 months with fasting labs prior.

## 2012-06-20 NOTE — Assessment & Plan Note (Signed)
INadequate control. Add amlodipine to lisinopril/HCTZ max. C all with Bp measurements in next 2 weeks.

## 2012-06-27 ENCOUNTER — Encounter: Payer: Self-pay | Admitting: Obstetrics & Gynecology

## 2012-06-27 ENCOUNTER — Encounter (HOSPITAL_COMMUNITY): Payer: Self-pay

## 2012-06-27 ENCOUNTER — Ambulatory Visit (INDEPENDENT_AMBULATORY_CARE_PROVIDER_SITE_OTHER): Payer: BC Managed Care – PPO | Admitting: Obstetrics & Gynecology

## 2012-06-27 ENCOUNTER — Encounter (HOSPITAL_COMMUNITY)
Admission: RE | Admit: 2012-06-27 | Discharge: 2012-06-27 | Disposition: A | Discharge status: Home / Self Care | Payer: BC Managed Care – PPO | Source: Ambulatory Visit | Attending: Obstetrics & Gynecology | Admitting: Obstetrics & Gynecology

## 2012-06-27 VITALS — BP 152/54 | HR 83 | Temp 97.1°F | Ht 68.0 in | Wt 313.3 lb

## 2012-06-27 DIAGNOSIS — N950 Postmenopausal bleeding: Secondary | ICD-10-CM

## 2012-06-27 HISTORY — DX: Shortness of breath: R06.02

## 2012-06-27 LAB — BASIC METABOLIC PANEL
CO2: 26 mEq/L (ref 19–32)
Calcium: 10 mg/dL (ref 8.4–10.5)
Chloride: 101 mEq/L (ref 96–112)
Creatinine, Ser: 0.9 mg/dL (ref 0.50–1.10)
Glucose, Bld: 108 mg/dL — ABNORMAL HIGH (ref 70–99)

## 2012-06-27 LAB — CBC
HCT: 41.7 % (ref 36.0–46.0)
Hemoglobin: 14.5 g/dL (ref 12.0–15.0)
MCH: 31.1 pg (ref 26.0–34.0)
MCV: 89.5 fL (ref 78.0–100.0)
Platelets: 272 10*3/uL (ref 150–400)

## 2012-06-27 NOTE — Progress Notes (Signed)
Patient ID: Cassie Nelson, female   DOB: 1950/05/30, 62 y.o.   MRN: [id number] Patient desires surgical management with hysteroscopy, D&C and endometrial ablation.  The risks of surgery were discussed in detail with the patient including but not limited to: bleeding which may require transfusion or reoperation; infection which may require prolonged hospitalization or re-hospitalization and antibiotic therapy; injury to bowel, bladder, ureters and major vessels or other surrounding organs; need for additional procedures including laparotomy; thromboembolic phenomenon, incisional problems and other postoperative or anesthesia complications.  Patient was told that the likelihood that her condition and symptoms will be treated effectively with this surgical management was very high; the postoperative expectations were also discussed in detail. The patient also understands the alternative treatment options which were discussed in full. All questions were answered.  Her preop visit is scheduled for today. Printed patient education handouts about the procedure were given to the patient to review at home.

## 2012-06-27 NOTE — Patient Instructions (Signed)
Your procedure is scheduled on:07/03/12  Enter through the Main Entrance at :0930 am Pick up desk phone and dial [phone] and inform us of your arrival.  Please call [phone] if you have any problems the morning of surgery.  Remember: Do not eat after midnight: WED. Clear liquids until 7am on Thursday  You may brush your teeth the morning of surgery.  Take these meds the morning of surgery with a sip of water:blood pressure meds  DO NOT wear jewelry, eye make-up, lipstick,body lotion, or dark fingernail polish.  (Polished toes are ok)   If you are to be admitted after surgery, leave suitcase in car until your room has been assigned. Patients discharged on the day of surgery will not be allowed to drive home. Wear loose fitting, comfortable clothes for your ride home.

## 2012-06-27 NOTE — Patient Instructions (Signed)
Hysteroscopy Hysteroscopy is a procedure used for looking inside the womb (uterus). It may be done for many different reasons, including:  To evaluate abnormal bleeding, fibroid (benign, noncancerous) tumors, polyps, scar tissue (adhesions), and possibly cancer of the uterus.  To look for lumps (tumors) and other uterine growths.  To look for causes of why a woman cannot get pregnant (infertility), causes of recurrent loss of pregnancy (miscarriages), or a lost intrauterine device (IUD).  To perform a sterilization by blocking the fallopian tubes from inside the uterus. A hysteroscopy should be done right after a menstrual period to be sure you are not pregnant. LET YOUR CAREGIVER KNOW ABOUT:   Allergies.  Medicines taken, including herbs, eyedrops, over-the-counter medicines, and creams.  Use of steroids (by mouth or creams).  Previous problems with anesthetics or numbing medicines.  History of bleeding or blood problems.  History of blood clots.  Possibility of pregnancy, if this applies.  Previous surgery.  Other health problems. RISKS AND COMPLICATIONS   Putting a hole in the uterus.  Excessive bleeding.  Infection.  Damage to the cervix.  Injury to other organs.  Allergic reaction to medicines.  Too much fluid used in the uterus for the procedure. BEFORE THE PROCEDURE   Do not take aspirin or blood thinners for a week before the procedure, or as directed. It can cause bleeding.  Arrive at least 60 minutes before the procedure or as directed to read and sign the necessary forms.  Arrange for someone to take you home after the procedure.  If you smoke, do not smoke for 2 weeks before the procedure. PROCEDURE   Your caregiver may give you medicine to relax you. He or she may also give you a medicine that numbs the area around the cervix (local anesthetic) or a medicine that makes you sleep (general anesthesia).  Sometimes, a medicine is placed in the cervix  the day before the procedure. This medicine makes the cervix have a larger opening (dilate). This makes it easier for the instrument to be inserted into the uterus.  A small instrument (hysteroscope) is inserted through the vagina into the uterus. This instrument is similar to a pencil-sized telescope with a light.  During the procedure, air or a liquid is put into the uterus, which allows the surgeon to see better.  Sometimes, tissue is gently scraped from inside the uterus. These tissue samples are sent to a specialist who looks at tissue samples (pathologist). The pathologist will give a report to your caregiver. This will help your caregiver decide if further treatment is necessary. The report will also help your caregiver decide on the best treatment if the test comes back abnormal. AFTER THE PROCEDURE   If you had a general anesthetic, you may be groggy for a couple hours after the procedure.  If you had a local anesthetic, you will be advised to rest at the surgical center or caregiver's office until you are stable and feel ready to go home.  You may have some cramping for a couple days.  You may have bleeding, which varies from light spotting for a few days to menstrual-like bleeding for up to 3 to 7 days. This is normal.  Have someone take you home. FINDING OUT THE RESULTS OF YOUR TEST Not all test results are available during your visit. If your test results are not back during the visit, make an appointment with your caregiver to find out the results. Do not assume everything is normal if you   have not heard from your caregiver or the medical facility. It is important for you to follow up on all of your test results. HOME CARE INSTRUCTIONS   Do not drive for 24 hours or as instructed.  Only take over-the-counter or prescription medicines for pain, discomfort, or fever as directed by your caregiver.  Do not take aspirin. It can cause or aggravate bleeding.  Do not drive or drink  alcohol while taking pain medicine.  You may resume your usual diet.  Do not use tampons, douche, or have sexual intercourse for 2 weeks, or as advised by your caregiver.  Rest and sleep for the first 24 to 48 hours.  Take your temperature twice a day for 4 to 5 days. Write it down. Give these temperatures to your caregiver if they are abnormal (above 98.6 F or 37.0 C).  Take medicines your caregiver has ordered as directed.  Follow your caregiver's advice regarding diet, exercise, lifting, driving, and general activities.  Take showers instead of baths for 2 weeks, or as recommended by your caregiver.  If you develop constipation:  Take a mild laxative with the advice of your caregiver.  Eat bran foods.  Drink enough water and fluids to keep your urine clear or pale yellow.  Try to have someone with you or available to you for the first 24 to 48 hours, especially if you had a general anesthetic.  Make sure you and your family understand everything about your operation and recovery.  Follow your caregiver's advice regarding follow-up appointments and Pap smears. SEEK MEDICAL CARE IF:   You feel dizzy or lightheaded.  You feel sick to your stomach (nauseous).  You develop abnormal vaginal discharge.  You develop a rash.  You have an abnormal reaction or allergy to your medicine.  You need stronger pain medicine. SEEK IMMEDIATE MEDICAL CARE IF:   Bleeding is heavier than a normal menstrual period or you have blood clots.  You have an oral temperature above 102 F (38.9 C), not controlled by medicine.  You have increasing cramps or pains not relieved with medicine.  You develop belly (abdominal) pain that does not seem to be related to the same area of earlier cramping and pain.  You pass out.  You develop pain in the tops of your shoulders (shoulder strap areas).  You develop shortness of breath. MAKE SURE YOU:   Understand these instructions.  Will watch  your condition.  Will get help right away if you are not doing well or get worse. Document Released: 04/09/2000 Document Revised: 03/26/2011 Document Reviewed: 08/02/2008 [employer] Patient Information 2014 [employer].  

## 2012-07-03 ENCOUNTER — Encounter (HOSPITAL_COMMUNITY): Payer: Self-pay | Admitting: Anesthesiology

## 2012-07-03 ENCOUNTER — Ambulatory Visit (HOSPITAL_COMMUNITY): Payer: BC Managed Care – PPO | Admitting: Anesthesiology

## 2012-07-03 ENCOUNTER — Ambulatory Visit (HOSPITAL_COMMUNITY)
Admission: RE | Admit: 2012-07-03 | Discharge: 2012-07-03 | Disposition: A | Discharge status: Home / Self Care | Payer: BC Managed Care – PPO | Source: Ambulatory Visit | Attending: Obstetrics & Gynecology | Admitting: Obstetrics & Gynecology

## 2012-07-03 ENCOUNTER — Encounter (HOSPITAL_COMMUNITY)
Admission: RE | Disposition: A | Discharge status: Home / Self Care | Payer: Self-pay | Source: Ambulatory Visit | Attending: Obstetrics & Gynecology

## 2012-07-03 DIAGNOSIS — Z87891 Personal history of nicotine dependence: Secondary | ICD-10-CM | POA: Insufficient documentation

## 2012-07-03 DIAGNOSIS — R9389 Abnormal findings on diagnostic imaging of other specified body structures: Secondary | ICD-10-CM | POA: Insufficient documentation

## 2012-07-03 DIAGNOSIS — K5730 Diverticulosis of large intestine without perforation or abscess without bleeding: Secondary | ICD-10-CM | POA: Insufficient documentation

## 2012-07-03 DIAGNOSIS — I10 Essential (primary) hypertension: Secondary | ICD-10-CM | POA: Insufficient documentation

## 2012-07-03 DIAGNOSIS — E669 Obesity, unspecified: Secondary | ICD-10-CM | POA: Insufficient documentation

## 2012-07-03 DIAGNOSIS — N840 Polyp of corpus uteri: Secondary | ICD-10-CM | POA: Insufficient documentation

## 2012-07-03 DIAGNOSIS — N950 Postmenopausal bleeding: Secondary | ICD-10-CM | POA: Insufficient documentation

## 2012-07-03 HISTORY — PX: DILITATION & CURRETTAGE/HYSTROSCOPY WITH NOVASURE ABLATION: SHX5568

## 2012-07-03 SURGERY — DILATATION & CURETTAGE/HYSTEROSCOPY WITH NOVASURE ABLATION
Anesthesia: General | Site: Vagina | Wound class: Clean Contaminated

## 2012-07-03 MED ORDER — ONDANSETRON HCL 4 MG/2ML IJ SOLN
INTRAMUSCULAR | Status: DC | PRN
  Administered 2012-07-03: 4 mg via INTRAVENOUS

## 2012-07-03 MED ORDER — FENTANYL CITRATE 0.05 MG/ML IJ SOLN: 25.0000 ug | INTRAMUSCULAR | Status: DC | PRN

## 2012-07-03 MED ORDER — KETOROLAC TROMETHAMINE 30 MG/ML IJ SOLN
INTRAMUSCULAR | Status: DC | PRN
  Administered 2012-07-03: 30 mg via INTRAVENOUS

## 2012-07-03 MED ORDER — LIDOCAINE HCL (CARDIAC) 20 MG/ML IV SOLN
INTRAVENOUS | Status: DC | PRN
  Administered 2012-07-03: 70 mg via INTRAVENOUS
  Administered 2012-07-03: 30 mg via INTRAVENOUS

## 2012-07-03 MED ORDER — FENTANYL CITRATE 0.05 MG/ML IJ SOLN
INTRAMUSCULAR | Status: AC
  Filled 2012-07-03: qty 2

## 2012-07-03 MED ORDER — MIDAZOLAM HCL 5 MG/5ML IJ SOLN
INTRAMUSCULAR | Status: DC | PRN
  Administered 2012-07-03: 1 mg via INTRAVENOUS

## 2012-07-03 MED ORDER — KETOROLAC TROMETHAMINE 30 MG/ML IJ SOLN
INTRAMUSCULAR | Status: AC
  Filled 2012-07-03: qty 1

## 2012-07-03 MED ORDER — PROPOFOL 10 MG/ML IV EMUL
INTRAVENOUS | Status: AC
  Filled 2012-07-03: qty 20

## 2012-07-03 MED ORDER — PROPOFOL 10 MG/ML IV BOLUS
INTRAVENOUS | Status: DC | PRN
  Administered 2012-07-03: 180 mg via INTRAVENOUS

## 2012-07-03 MED ORDER — LIDOCAINE HCL (CARDIAC) 20 MG/ML IV SOLN
INTRAVENOUS | Status: AC
  Filled 2012-07-03: qty 5

## 2012-07-03 MED ORDER — OXYCODONE-ACETAMINOPHEN 5-325 MG PO TABS: 1.0000 | ORAL_TABLET | Freq: Four times a day (QID) | ORAL | Status: DC | PRN

## 2012-07-03 MED ORDER — ONDANSETRON HCL 4 MG/2ML IJ SOLN
INTRAMUSCULAR | Status: AC
  Filled 2012-07-03: qty 2

## 2012-07-03 MED ORDER — LACTATED RINGERS IV SOLN
INTRAVENOUS | Status: DC
  Administered 2012-07-03 (×2): via INTRAVENOUS

## 2012-07-03 MED ORDER — LACTATED RINGERS IV SOLN: INTRAVENOUS | Status: DC

## 2012-07-03 MED ORDER — FENTANYL CITRATE 0.05 MG/ML IJ SOLN
INTRAMUSCULAR | Status: DC | PRN
  Administered 2012-07-03 (×2): 50 ug via INTRAVENOUS

## 2012-07-03 MED ORDER — LACTATED RINGERS IV SOLN
INTRAVENOUS | Status: DC | PRN
  Administered 2012-07-03: 3000 mL via INTRAUTERINE

## 2012-07-03 MED ORDER — KETOROLAC TROMETHAMINE 30 MG/ML IJ SOLN: 15.0000 mg | Freq: Once | INTRAMUSCULAR | Status: DC | PRN

## 2012-07-03 MED ORDER — BUPIVACAINE HCL (PF) 0.5 % IJ SOLN
INTRAMUSCULAR | Status: DC | PRN
  Administered 2012-07-03: 20 mL

## 2012-07-03 MED ORDER — MIDAZOLAM HCL 2 MG/2ML IJ SOLN
INTRAMUSCULAR | Status: AC
  Filled 2012-07-03: qty 2

## 2012-07-03 MED ORDER — IBUPROFEN 600 MG PO TABS: 600.0000 mg | ORAL_TABLET | Freq: Four times a day (QID) | ORAL | Status: DC | PRN

## 2012-07-03 MED ORDER — BUPIVACAINE HCL (PF) 0.5 % IJ SOLN
INTRAMUSCULAR | Status: AC
  Filled 2012-07-03: qty 30

## 2012-07-03 MED ORDER — MEPERIDINE HCL 25 MG/ML IJ SOLN: 6.2500 mg | INTRAMUSCULAR | Status: DC | PRN

## 2012-07-03 SURGICAL SUPPLY — 13 items
ABLATOR ENDOMETRIAL BIPOLAR (ABLATOR) ×3 IMPLANT
CATH ROBINSON RED A/P 16FR (CATHETERS) ×3 IMPLANT
CLOTH BEACON ORANGE TIMEOUT ST (SAFETY) ×3 IMPLANT
CONTAINER PREFILL 10% NBF 60ML (FORM) IMPLANT
DRESSING TELFA 8X3 (GAUZE/BANDAGES/DRESSINGS) ×3 IMPLANT
GLOVE BIO SURGEON STRL SZ7 (GLOVE) ×3 IMPLANT
GLOVE BIOGEL PI IND STRL 7.0 (GLOVE) ×2 IMPLANT
GLOVE BIOGEL PI INDICATOR 7.0 (GLOVE) ×1
GOWN STRL REIN XL XLG (GOWN DISPOSABLE) ×6 IMPLANT
PACK HYSTEROSCOPY LF (CUSTOM PROCEDURE TRAY) ×3 IMPLANT
PAD OB MATERNITY 4.3X12.25 (PERSONAL CARE ITEMS) ×3 IMPLANT
TOWEL OR 17X24 6PK STRL BLUE (TOWEL DISPOSABLE) ×6 IMPLANT
WATER STERILE IRR 1000ML POUR (IV SOLUTION) ×3 IMPLANT

## 2012-07-03 NOTE — Op Note (Signed)
PRE-OPERATIVE DIAGNOSIS: Post menopausal Bleeding  POST-OPERATIVE DIAGNOSIS:Post menopausal Bleeding  PROCEDURE: Procedure(s): Hysteroscopy with dilation and curettage and  NOVASURE ENDOMETRIAL ABLATION  SURGEON: Willodean Rosenthal, M.D. ASSISTANTS: none  ANESTHESIA: Genreal with LMA / Paracervical block with 20cc of 0.5% Marcaine EBL: <5cc  SPECIMEN: endometrial curetting and endometrial polyps  PLAN OF CARE: Discharge to home after PACU  PATIENT DISPOSITION: PACU - hemodynamically stable.   The risks, benefits, and alternatives of surgery were explained, understood, and accepted. The consents were signed and all questions were answered. She was taken to the operating room and general anesthesia was applied without complication. She was placed in the dorsal lithotomy position and her vagina and abdomen were prepped and draped in the usual sterile fashion. A bimanual exam revealed a normal size and shape anteverted mobile uterus. Her adnexa were non-enlarged.  A bivalved speculum was placed in the patients' vagina and the anterior lip of the cervix was grasped with a single toothed tenaculum. A paracervical block was performed at 5 and 7 o'clock with 20cc of 0.5% Marcaine.   The endometrial cavity was sounded to 7.0cm and the endocervical length measured 3cm. A hysteroscope was inserted and the endometrium was noted to be thickened with several large polyps.  The scope was removed and a Randall stone forceps was used to remove the large polyps.  This was confirmed with rechecking with the hysteroscope.  A sharp currete was used to scape the lining of the uterus until a gritty texture was noted throughout.  Specimens were sent to pathology.  The NovaSure device was then inserted and seated using 4.0cm as the cavity length and 4.4cm as the cavity width.  The total activation time was 117 sec at a power of 94.  The hysteroscope was reinserted and an even burn pattern was noted to the fundus.  The  single toothed tenaculum was removed at the end of the case and no bleeding was noted from the cervix.   The patient was extubated and taken to the recovery room in stable condition.  Sponge, lap and instrument counts were correct.  There were no complications.   Clennon Nasca L. Harraway-Smith, M.D., [facility]

## 2012-07-03 NOTE — Transfer of Care (Signed)
Immediate Anesthesia Transfer of Care Note  Patient: Cassie Nelson  Procedure(s) Performed: Procedure(s) with comments: DILATATION AND CURETTAGE /Diagnostic hysteroscopy (N/A) - Diagnostic hysteroscopy  Patient Location: PACU  Anesthesia Type:General  Level of Consciousness: awake, oriented and patient cooperative  Airway & Oxygen Therapy: Patient Spontanous Breathing and Patient connected to nasal cannula oxygen  Post-op Assessment: Report given to PACU RN and Post -op Vital signs reviewed and stable  Post vital signs: Reviewed and stable  Complications: No apparent anesthesia complications

## 2012-07-03 NOTE — H&P (Addendum)
Patient ID: Cassie Nelson, female DOB: 1950/09/16, 62 y.o. MRN: [id number]  HPI Pt with h/o PMPB presents for f/u of sono and bx. She had had no further bleeding since her last visit. She was seen by primary care. Currently on BP meds and BP under better control.   Past Medical History  Diagnosis Date  . Diverticular disease   . Hx of diverticulitis of colon 2009, 2010  . Hypertension   . Shortness of breath     on exertion   Past Surgical History  Procedure Laterality Date  . Tonsillectomy    . 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 Mouth surgery      biopsy of cheek   No current facility-administered medications on file prior to encounter.   Current Outpatient Prescriptions on File Prior to Encounter  Medication Sig Dispense Refill  . fish oil-omega-3 fatty acids 1000 MG capsule Take 2 g by mouth daily.      . Multiple Vitamin (MULTIVITAMIN) tablet Take 1 tablet by mouth daily.      No Known Allergies\ Lisinopril Amlodipine History   Social History  . Marital Status: Single    Spouse Name: N/A    Number of Children: 0  . Years of Education: N/A   Occupational History  . sales rep    Social History Main Topics  . Smoking status: Former Smoker -- 25 years    Quit date: 06/22/2010  . Smokeless tobacco: Former Neurosurgeon    Quit date: 09/23/2010  . Alcohol Use: 0.5 oz/week    1 drink(s) per week     Comment: occasionally  . Drug Use: No  . Sexually Active: No   Other Topics Concern  . Not on file   Social History Narrative   Single, No kids.   Works as a Human resources officer for [employer].       Review of Systems  Objective:   Physical Exam  BP 184/67  Pulse 86  Temp(Src) 98.1 F (36.7 C) (Oral)  Resp 20  SpO2 100% Exam done prior visit- WNL Abd: obese, NT, ND   05/14/2012  Surg Path  Diagnosis  Endometrium, biopsy  - ATROPHIC APPEARING ENDOMETRIUM.  - THERE IS NO EVIDENCE OF HYPERPLASIA OR MALIGNANCY.  4//30/2014  PAP:  WNL with negative HPV  05/23/2012  Findings:  Uterus: 8.4 x 3.6 x 5.0 cm. No fibroids identified.  Endometrium: Diffuse endometrial thickening with tiny internal  cystic foci. Double layer endometrial thickness measures 18 mm  transvaginally. No focal mass lesion visualized.These findings are  highly suspicious for cystic endometrial hyperplasia, although  carcinoma cannot definitely be excluded.  Right ovary: 2.4 x 2.1 x 1.9 cm. Normal appearance. No adnexal  mass identified.  Left ovary: 2.5 x 1.6 x 1.9 cm. Normal appearance. No adnexal  mass identified.  Other Findings: No free fluid  IMPRESSION:  1. Abnormal endometrial thickness of 18 mm, with diffuse internal  cystic foci noted. These findings strongly suggests cystic  endometrial hyperplasia, however carcinoma cannot definitely be  excluded. In the setting of post-menopausal bleeding, endometrial  sampling is indicated to exclude carcinoma. If results are benign,  sonohysterogram should be considered for focal lesion work-up.  (Ref: Radiological Reasoning: Algorithmic Workup of Abnormal  Vaginal Bleeding with Endovaginal Sonography and Sonohysterography.  AJR 2008; [id number])  2. Normal appearance of both ovaries. No adnexal mass identified.  Assessment:   PMPB in pt with thickened endometrium. D/w pt options  for eval of thickened endometrium rec hysteroscopy to evaluate  Plan:   Patient desires surgical management with diagnostic endometrium. The risks of surgery were discussed in detail with the patient including but not limited to: perforation; bleeding which may require transfusion or reoperation; infection which may require prolonged hospitalization or re-hospitalization and antibiotic therapy; injury to bowel, bladder, ureters and major vessels or other surrounding organs; need for additional procedures including laparotomy; thromboembolic phenomenon, incisional problems and other postoperative or anesthesia complications. The  patient also understands the alternative treatment options which were discussed in full. All questions were answered.Will proceed with the D&C and ablation.  Kore Madlock L. Harraway-Smith, M.D., [facility]

## 2012-07-03 NOTE — Anesthesia Postprocedure Evaluation (Signed)
Anesthesia Post Note  Patient: Cassie Nelson  Procedure(s) Performed: Procedure(s) (LRB): DILATATION & CURETTAGE/HYSTEROSCOPY WITH NOVASURE ABLATION (N/A)  Anesthesia type: General  Patient location: PACU  Post pain: Pain level controlled  Post assessment: Post-op Vital signs reviewed  Last Vitals:  Filed Vitals:   07/03/12 1015  BP: 129/53  Pulse: 67  Temp:   Resp: 18    Post vital signs: Reviewed  Level of consciousness: sedated  Complications: No apparent anesthesia complications

## 2012-07-03 NOTE — Anesthesia Preprocedure Evaluation (Signed)
Anesthesia Evaluation  Patient identified by MRN, date of birth, ID band Patient awake    Reviewed: Allergy & Precautions, H&P , NPO status , Patient's Chart, lab work & pertinent test results  Airway Mallampati: II TM Distance: >3 FB Neck ROM: full    Dental no notable dental hx. (+) Teeth Intact   Pulmonary neg pulmonary ROS,    Pulmonary exam normal       Cardiovascular hypertension, Pt. on medications negative cardio ROS      Neuro/Psych negative neurological ROS  negative psych ROS   GI/Hepatic negative GI ROS, Neg liver ROS,   Endo/Other  Morbid obesity  Renal/GU negative Renal ROS  negative genitourinary   Musculoskeletal   Abdominal (+) + obese,   Peds negative pediatric ROS (+)  Hematology negative hematology ROS (+)   Anesthesia Other Findings   Reproductive/Obstetrics negative OB ROS                           Anesthesia Physical Anesthesia Plan  ASA: III  Anesthesia Plan: General   Post-op Pain Management:    Induction: Intravenous  Airway Management Planned: LMA  Additional Equipment:   Intra-op Plan:   Post-operative Plan:   Informed Consent: I have reviewed the patients History and Physical, chart, labs and discussed the procedure including the risks, benefits and alternatives for the proposed anesthesia with the patient or authorized representative who has indicated his/her understanding and acceptance.     Plan Discussed with: CRNA and Surgeon  Anesthesia Plan Comments:         Anesthesia Quick Evaluation

## 2012-07-04 ENCOUNTER — Encounter (HOSPITAL_COMMUNITY): Payer: Self-pay | Admitting: Obstetrics & Gynecology

## 2012-07-04 ENCOUNTER — Encounter: Payer: Self-pay | Admitting: *Deleted

## 2012-07-11 ENCOUNTER — Telehealth: Payer: Self-pay | Admitting: *Deleted

## 2012-07-11 NOTE — Telephone Encounter (Signed)
Pt is requesting results from her procedure.

## 2012-07-14 NOTE — Telephone Encounter (Signed)
Called patient back and left her a voicemail to return our call.

## 2012-07-14 NOTE — Telephone Encounter (Signed)
Spoke to patient and read result from surgical pathology report. Advised patient that she has f/u appt on 08/08/12 for post op and discus result further. Patient states understanding and satisfied.

## 2012-08-08 ENCOUNTER — Encounter: Payer: Self-pay | Admitting: Family Medicine

## 2012-08-08 ENCOUNTER — Ambulatory Visit (INDEPENDENT_AMBULATORY_CARE_PROVIDER_SITE_OTHER): Payer: BC Managed Care – PPO | Admitting: Family Medicine

## 2012-08-08 VITALS — BP 138/72 | HR 73 | Temp 97.2°F | Ht 68.0 in | Wt 313.4 lb

## 2012-08-08 DIAGNOSIS — Z1239 Encounter for other screening for malignant neoplasm of breast: Secondary | ICD-10-CM

## 2012-08-08 DIAGNOSIS — N898 Other specified noninflammatory disorders of vagina: Secondary | ICD-10-CM

## 2012-08-08 DIAGNOSIS — Z09 Encounter for follow-up examination after completed treatment for conditions other than malignant neoplasm: Secondary | ICD-10-CM

## 2012-08-08 NOTE — Patient Instructions (Signed)
Endovenous Ablation °Care After °Refer to this sheet in the next few weeks. These instructions provide you with information on caring for yourself after your procedure. Your caregiver may also give you more specific instructions. Your treatment has been planned according to current medical practices, but problems sometimes occur. Call your caregiver if you have any problems or questions after your procedure. °HOME CARE INSTRUCTIONS  °· Only take over-the-counter or prescription medicines for pain or discomfort as directed by your caregiver. °· Follow any diet instructions from your caregiver. °· Follow your caregiver's instructions for rest, bathing, showering, and physical activity. °· Walk regularly. This will help with healing and help prevent blood clots from forming. °· Avoid strenuous workouts as directed by your caregiver. °· Avoid long car trips and air travel for 2 weeks after the procedure, or as directed. °· Continue to wear compression stockings for 1 week, or as directed. °SEEK IMMEDIATE MEDICAL CARE IF:  °· You have a fever. °· You develop pain, redness, or swelling around the surgical cut (incision). °· You notice red streaks on the skin coming from the incision. °· You start to bleed. °· You develop nausea or vomiting. °· You have trouble breathing. °· You develop chest pain. °MAKE SURE YOU:  °· Understand these instructions. °· Will watch your condition. °· Will get help right away if you are not doing well or get worse. °Document Released: 12/21/2010 Document Revised: 03/26/2011 Document Reviewed: 12/21/2010 °ExitCare® Patient Information ©2014 [employer]. ° °

## 2012-08-08 NOTE — Progress Notes (Signed)
Patient ID: Cassie Nelson, female   DOB: [birth year], 62 y.o.   MRN: [id number]   S: Pt is s/p endometrial ablation 6 weeks ago. Benign findings. Still having spotting though getting a bit better. Has to wear a pantiliner daily - mostly spotting when wipes or a few stains on pad. No pelvic pain. No vaginal itching or burning. Does have "normal" discharge and some odor. No fever/chills/dysuria/nausea/vomiting.  Filed Vitals:   08/08/12 0831  BP: 138/72  Pulse: 73  Temp: 97.2 F (36.2 C)    EXAM: Alert, oriented, no distress ABD:  Soft, non-tender, obese GU:  Normal external genitalia, damp. Clear/mucoid vaginal discharge with some odor. No blood. Normal vagina. Normal cervix - nulliparous. No CMT or adnexal tenderness. Bimanual exam limited by maternal body habitus.  Surgical Pathology:  Benign endometrial polyp, no atypia, hyperplasia or malignancy.  A/P 62 y.o. postmenopausal female here for post-op follow-up s/p D&C/hysteroscopy and endometrial ablation on 07/03/12 - Continued spotting - seems to be getting better.  - Reassured patient that findings benign, will likely stop in the next few weeks. - Wet prep done - F/U as needed if still spotting in 1 month - F/U one year for well woman exam - mammogram ordered - has not had one in several years

## 2012-08-15 ENCOUNTER — Ambulatory Visit
Admission: RE | Admit: 2012-08-15 | Discharge: 2012-08-15 | Disposition: A | Discharge status: Home / Self Care | Payer: BC Managed Care – PPO | Source: Ambulatory Visit | Attending: Family Medicine | Admitting: Family Medicine

## 2012-08-15 DIAGNOSIS — Z1239 Encounter for other screening for malignant neoplasm of breast: Secondary | ICD-10-CM

## 2012-09-04 ENCOUNTER — Other Ambulatory Visit: Payer: Self-pay | Admitting: Family Medicine

## 2012-09-04 DIAGNOSIS — R928 Other abnormal and inconclusive findings on diagnostic imaging of breast: Secondary | ICD-10-CM

## 2012-09-24 ENCOUNTER — Encounter: Payer: Self-pay | Admitting: *Deleted

## 2012-09-29 ENCOUNTER — Ambulatory Visit
Admission: RE | Admit: 2012-09-29 | Discharge: 2012-09-29 | Disposition: A | Discharge status: Home / Self Care | Payer: BC Managed Care – PPO | Source: Ambulatory Visit | Attending: Family Medicine | Admitting: Family Medicine

## 2012-09-29 DIAGNOSIS — R928 Other abnormal and inconclusive findings on diagnostic imaging of breast: Secondary | ICD-10-CM

## 2012-10-01 ENCOUNTER — Other Ambulatory Visit: Payer: Self-pay | Admitting: Family Medicine

## 2012-11-20 ENCOUNTER — Other Ambulatory Visit: Payer: Self-pay

## 2012-11-21 ENCOUNTER — Telehealth: Payer: Self-pay

## 2012-11-21 NOTE — Telephone Encounter (Signed)
Pt is out of town and not returning home until 11/23/12. On 11/20/12 pt started with left lower quadrant pain,several loose and semi formed stools (not diarrhea) and no mucus in stools. No fever. Pt has hx of diverticulosis. Today pain level is 3(pt took extra strength tylenol). No nausea and vomiting and no UTI symptoms. Pt thinks beginning of diverticulosis. Pt wants to know if med could be sent to [facility] [place]. Pt request cb.

## 2012-11-21 NOTE — Telephone Encounter (Signed)
For her own safety we cannot send in antibiotics with out her being seen. Given she is out of town she needs to go to an Urgent care to be seen today.

## 2012-11-21 NOTE — Telephone Encounter (Signed)
Patient notified as instructed by telephone. 

## 2012-12-25 ENCOUNTER — Other Ambulatory Visit: Payer: Self-pay | Admitting: Gynecology

## 2012-12-25 ENCOUNTER — Other Ambulatory Visit: Payer: Self-pay | Admitting: Obstetrics & Gynecology

## 2012-12-25 DIAGNOSIS — N6320 Unspecified lump in the left breast, unspecified quadrant: Secondary | ICD-10-CM

## 2012-12-30 ENCOUNTER — Other Ambulatory Visit: Payer: Self-pay | Admitting: Family Medicine

## 2012-12-30 DIAGNOSIS — N6320 Unspecified lump in the left breast, unspecified quadrant: Secondary | ICD-10-CM

## 2013-01-02 ENCOUNTER — Other Ambulatory Visit: Payer: Self-pay | Admitting: Family Medicine

## 2013-01-02 ENCOUNTER — Ambulatory Visit
Admission: RE | Admit: 2013-01-02 | Discharge: 2013-01-02 | Disposition: A | Discharge status: Home / Self Care | Payer: BC Managed Care – PPO | Source: Ambulatory Visit | Attending: Obstetrics & Gynecology | Admitting: Obstetrics & Gynecology

## 2013-01-02 ENCOUNTER — Ambulatory Visit
Admission: RE | Admit: 2013-01-02 | Discharge: 2013-01-02 | Disposition: A | Discharge status: Home / Self Care | Payer: BC Managed Care – PPO | Source: Ambulatory Visit | Attending: Family Medicine | Admitting: Family Medicine

## 2013-01-02 DIAGNOSIS — N6320 Unspecified lump in the left breast, unspecified quadrant: Secondary | ICD-10-CM

## 2013-03-03 ENCOUNTER — Other Ambulatory Visit: Payer: Self-pay | Admitting: Family Medicine

## 2013-03-31 ENCOUNTER — Other Ambulatory Visit: Payer: Self-pay | Admitting: Family Medicine

## 2013-04-26 ENCOUNTER — Other Ambulatory Visit: Payer: Self-pay | Admitting: Family Medicine

## 2013-05-01 ENCOUNTER — Telehealth: Payer: Self-pay

## 2013-05-01 NOTE — Telephone Encounter (Signed)
Pt called for refill on lisinopril HCTZ; spoke with pharmacist [facility] and refill ready for pick up. Pt voiced understanding and will cb to schedule appt.

## 2013-06-05 ENCOUNTER — Other Ambulatory Visit: Payer: Self-pay | Admitting: Family Medicine

## 2013-07-03 ENCOUNTER — Other Ambulatory Visit: Payer: Self-pay | Admitting: Family Medicine

## 2013-08-02 ENCOUNTER — Other Ambulatory Visit: Payer: Self-pay | Admitting: Family Medicine

## 2013-08-03 ENCOUNTER — Telehealth: Payer: Self-pay | Admitting: Family Medicine

## 2013-08-03 NOTE — Telephone Encounter (Signed)
cpx  10/6 labs 10/1  Pt aware

## 2013-08-03 NOTE — Telephone Encounter (Signed)
Left message for pt to call office please schedule appointment  ----- Message -----  From: Carter Kitten, CMA  Sent: 08/02/2013 7:51 PM  To: Salvatore Marvel   Please schedule CPE with fasting labs prior for Dr. Diona Browner.   Thanks,  Butch Penny

## 2013-08-04 ENCOUNTER — Ambulatory Visit (INDEPENDENT_AMBULATORY_CARE_PROVIDER_SITE_OTHER): Payer: BC Managed Care – PPO | Admitting: Family Medicine

## 2013-08-04 ENCOUNTER — Encounter: Payer: Self-pay | Admitting: Family Medicine

## 2013-08-04 VITALS — BP 140/60 | HR 89 | Temp 98.5°F | Ht 67.21 in | Wt 329.2 lb

## 2013-08-04 DIAGNOSIS — Z2911 Encounter for prophylactic immunotherapy for respiratory syncytial virus (RSV): Secondary | ICD-10-CM

## 2013-08-04 DIAGNOSIS — Z23 Encounter for immunization: Secondary | ICD-10-CM

## 2013-08-04 DIAGNOSIS — E781 Pure hyperglyceridemia: Secondary | ICD-10-CM

## 2013-08-04 DIAGNOSIS — I10 Essential (primary) hypertension: Secondary | ICD-10-CM

## 2013-08-04 MED ORDER — AMLODIPINE BESYLATE 5 MG PO TABS: ORAL_TABLET | ORAL | Status: DC

## 2013-08-04 MED ORDER — LISINOPRIL-HYDROCHLOROTHIAZIDE 20-12.5 MG PO TABS: ORAL_TABLET | ORAL | Status: DC

## 2013-08-04 NOTE — Patient Instructions (Signed)
Work on gradual increase of exercise and healthy eating. Prepare healthy snacks ahead of time. Watch portion size. Follow BP at home off and on.

## 2013-08-04 NOTE — Progress Notes (Signed)
Pre visit review using our clinic review tool, if applicable. No additional management support is needed unless otherwise documented below in the visit note. 

## 2013-08-04 NOTE — Addendum Note (Signed)
Addended by: Carter Kitten on: 08/04/2013 03:49 PM   Modules accepted: Orders

## 2013-08-04 NOTE — Assessment & Plan Note (Signed)
Due for re-eval. 

## 2013-08-04 NOTE — Assessment & Plan Note (Signed)
Well controlled. Continue current medication. Refilled

## 2013-08-04 NOTE — Progress Notes (Signed)
63 year old female presents chronic med management/ follow up of HTN.   She had been to GYN for pap, breast, mammogram.  She wants to come here for CPX from now on.  Hypertension: Borderline control today on lisinopril HCTZ 2 tabs daily and amlodipine  BP Readings from Last 3 Encounters:  08/04/13 140/60  08/08/12 138/72  07/03/12 130/57  Using medication without problems or lightheadedness: None  Chest pain with exertion:None  Edema:None  Short of breath: SOB with weight issues.  Average home BPs: 120-125/80-90 Other issues:  Exercise: off and on.  Moderate diet: eating out a lot.  Due for re-eval of labs including chol and glucose.  Review of Systems  Constitutional: Negative for fever and fatigue.  HENT: Negative for ear pain.  Eyes: Negative for pain.  Respiratory: Negative for chest tightness and shortness of breath.  Cardiovascular: Negative for chest pain, palpitations and leg swelling.  Gastrointestinal: Negative for abdominal pain.  Genitourinary: Negative for dysuria.  Objective:   Physical Exam  Constitutional: Vital signs are normal. She appears well-developed and well-nourished. She is cooperative. Non-toxic appearance. She does not appear ill. No distress.  HENT:  Head: Normocephalic.  Right Ear: Hearing, tympanic membrane, external ear and ear canal normal. Tympanic membrane is not erythematous, not retracted and not bulging.  Left Ear: Hearing, tympanic membrane, external ear and ear canal normal. Tympanic membrane is not erythematous, not retracted and not bulging.  Nose: No mucosal edema or rhinorrhea. Right sinus exhibits no maxillary sinus tenderness and no frontal sinus tenderness. Left sinus exhibits no maxillary sinus tenderness and no frontal sinus tenderness.  Mouth/Throat: Uvula is midline, oropharynx is clear and moist and mucous membranes are normal.  Eyes: Conjunctivae, EOM and lids are normal. Pupils are equal, round, and reactive to light. No  foreign bodies found.  Neck: Trachea normal and normal range of motion. Neck supple. Carotid bruit is not present. No mass and no thyromegaly present.  Cardiovascular: Normal rate, regular rhythm, S1 normal, S2 normal, normal heart sounds, intact distal pulses and normal pulses. Exam reveals no gallop and no friction rub.  No murmur heard.  Pulmonary/Chest: Effort normal and breath sounds normal. Not tachypneic. No respiratory distress. She has no decreased breath sounds. She has no wheezes. She has no rhonchi. She has no rales.  Abdominal: Soft. Normal appearance and bowel sounds are normal. There is no tenderness.  Neurological: She is alert.  Skin: Skin is warm, dry and intact. No rash noted. Psychiatric: Her speech is normal and behavior is normal. Judgment and thought content normal. Her mood appears not anxious. Cognition and memory are normal. She does not exhibit a depressed mood.  Assessment & Plan:

## 2013-08-05 ENCOUNTER — Telehealth: Payer: Self-pay | Admitting: Family Medicine

## 2013-08-05 NOTE — Telephone Encounter (Signed)
Relevant patient education assigned to patient using Emmi. ° °

## 2013-09-14 ENCOUNTER — Other Ambulatory Visit: Payer: Self-pay

## 2013-09-14 DIAGNOSIS — Z1231 Encounter for screening mammogram for malignant neoplasm of breast: Secondary | ICD-10-CM

## 2013-09-17 ENCOUNTER — Ambulatory Visit
Admission: RE | Admit: 2013-09-17 | Discharge: 2013-09-17 | Disposition: A | Discharge status: Home / Self Care | Payer: BC Managed Care – PPO | Source: Ambulatory Visit

## 2013-09-17 DIAGNOSIS — Z1231 Encounter for screening mammogram for malignant neoplasm of breast: Secondary | ICD-10-CM

## 2013-10-14 ENCOUNTER — Telehealth: Payer: Self-pay | Admitting: Family Medicine

## 2013-10-14 DIAGNOSIS — E781 Pure hyperglyceridemia: Secondary | ICD-10-CM

## 2013-10-14 DIAGNOSIS — R7303 Prediabetes: Secondary | ICD-10-CM

## 2013-10-14 NOTE — Telephone Encounter (Signed)
Message copied by Jinny Sanders on Wed Oct 14, 2013 11:13 PM ------      Message from: [name]      Created: Thu Oct 08, 2013 12:44 PM      Regarding: Lab orders for [facility], 10.1.15       Patient is scheduled for CPX labs, please order future labs, Thanks , Cassie Nelson       ------

## 2013-10-15 ENCOUNTER — Other Ambulatory Visit (INDEPENDENT_AMBULATORY_CARE_PROVIDER_SITE_OTHER): Payer: BC Managed Care – PPO

## 2013-10-15 DIAGNOSIS — E781 Pure hyperglyceridemia: Secondary | ICD-10-CM

## 2013-10-15 DIAGNOSIS — R7309 Other abnormal glucose: Secondary | ICD-10-CM

## 2013-10-15 DIAGNOSIS — R7303 Prediabetes: Secondary | ICD-10-CM

## 2013-10-15 LAB — COMPREHENSIVE METABOLIC PANEL
ALBUMIN: 3.9 g/dL (ref 3.5–5.2)
ALK PHOS: 58 U/L (ref 39–117)
ALT: 28 U/L (ref 0–35)
AST: 25 U/L (ref 0–37)
BILIRUBIN TOTAL: 0.6 mg/dL (ref 0.2–1.2)
BUN: 17 mg/dL (ref 6–23)
CO2: 23 mEq/L (ref 19–32)
Calcium: 9.2 mg/dL (ref 8.4–10.5)
Chloride: 105 mEq/L (ref 96–112)
Creatinine, Ser: 0.9 mg/dL (ref 0.4–1.2)
GFR: 63.9 mL/min (ref >60.00)
Glucose, Bld: 116 mg/dL — ABNORMAL HIGH (ref 70–99)
POTASSIUM: 3.9 meq/L (ref 3.5–5.1)
SODIUM: 137 meq/L (ref 135–145)
TOTAL PROTEIN: 7.7 g/dL (ref 6.0–8.3)

## 2013-10-15 LAB — HEMOGLOBIN A1C: HEMOGLOBIN A1C: 6 % (ref 4.6–6.5)

## 2013-10-15 LAB — LIPID PANEL
Cholesterol: 197 mg/dL (ref 0–200)
HDL: 34.8 mg/dL — ABNORMAL LOW (ref >39.00)
LDL Cholesterol: 123 mg/dL — ABNORMAL HIGH (ref 0–99)
NONHDL: 162.2
Total CHOL/HDL Ratio: 6
Triglycerides: 194 mg/dL — ABNORMAL HIGH (ref 0.0–149.0)
VLDL: 38.8 mg/dL (ref 0.0–40.0)

## 2013-10-20 ENCOUNTER — Other Ambulatory Visit (HOSPITAL_COMMUNITY)
Admission: RE | Admit: 2013-10-20 | Discharge: 2013-10-20 | Disposition: A | Discharge status: Home / Self Care | Payer: BC Managed Care – PPO | Source: Ambulatory Visit | Attending: Family Medicine | Admitting: Family Medicine

## 2013-10-20 ENCOUNTER — Ambulatory Visit (INDEPENDENT_AMBULATORY_CARE_PROVIDER_SITE_OTHER): Payer: BC Managed Care – PPO | Admitting: Family Medicine

## 2013-10-20 ENCOUNTER — Encounter: Payer: Self-pay | Admitting: Family Medicine

## 2013-10-20 VITALS — BP 140/58 | HR 85 | Temp 98.9°F | Ht 67.75 in | Wt 332.8 lb

## 2013-10-20 DIAGNOSIS — Z0000 Encounter for general adult medical examination without abnormal findings: Secondary | ICD-10-CM

## 2013-10-20 DIAGNOSIS — E2839 Other primary ovarian failure: Secondary | ICD-10-CM

## 2013-10-20 DIAGNOSIS — E8881 Metabolic syndrome: Secondary | ICD-10-CM

## 2013-10-20 DIAGNOSIS — Z124 Encounter for screening for malignant neoplasm of cervix: Secondary | ICD-10-CM

## 2013-10-20 DIAGNOSIS — Z01419 Encounter for gynecological examination (general) (routine) without abnormal findings: Secondary | ICD-10-CM | POA: Insufficient documentation

## 2013-10-20 DIAGNOSIS — K5790 Diverticulosis of intestine, part unspecified, without perforation or abscess without bleeding: Secondary | ICD-10-CM | POA: Insufficient documentation

## 2013-10-20 DIAGNOSIS — R7309 Other abnormal glucose: Secondary | ICD-10-CM

## 2013-10-20 DIAGNOSIS — E781 Pure hyperglyceridemia: Secondary | ICD-10-CM

## 2013-10-20 DIAGNOSIS — Z1211 Encounter for screening for malignant neoplasm of colon: Secondary | ICD-10-CM

## 2013-10-20 DIAGNOSIS — I10 Essential (primary) hypertension: Secondary | ICD-10-CM

## 2013-10-20 DIAGNOSIS — R7303 Prediabetes: Secondary | ICD-10-CM

## 2013-10-20 DIAGNOSIS — Z23 Encounter for immunization: Secondary | ICD-10-CM

## 2013-10-20 NOTE — Assessment & Plan Note (Signed)
Continue fish oil, work on lifestyle changes.

## 2013-10-20 NOTE — Assessment & Plan Note (Signed)
Encouraged exercise, weight loss, healthy eating habits. ? ?

## 2013-10-20 NOTE — Progress Notes (Signed)
Pre visit review using our clinic review tool, if applicable. No additional management support is needed unless otherwise documented below in the visit note. 

## 2013-10-20 NOTE — Patient Instructions (Addendum)
Keep working on [employer] and weight loss and increasing exercise.  Stopa the lab on way out for stool test.   Stop for referral on way out for bone density.

## 2013-10-20 NOTE — Addendum Note (Signed)
Addended by: Carter Kitten on: 10/20/2013 03:18 PM   Modules accepted: Orders

## 2013-10-20 NOTE — Progress Notes (Signed)
63 year old female presents for annual exam. She had been to GYN for pap, breast, mammogram previously. She wants to come here for CPX from now on.  Hypertension: good control at home on lisinopril HCTZ 2 tabs daily.  She has an element of white coat HTN. BP Readings from Last 3 Encounters:  10/20/13 140/58  08/04/13 140/60  08/08/12 138/72  Using medication without problems or lightheadedness: None  Chest pain with exertion:None  Edema:None  Short of breath:some due to weight and deconditioning.  Average home BPs: 80/64-99/70 Other issues:   Prediabetes.  Lab Results  Component Value Date   HGBA1C 6.0 10/15/2013   Moderate diet control, does good for a while then stops. Has been trying to increase water in diet. Exercise: She is walking more, has fit bit. Has had some weight gain. Wt Readings from Last 3 Encounters:  10/20/13 332 lb 12 oz (150.934 kg)  08/04/13 329 lb 4 oz (149.347 kg)  08/08/12 313 lb 6.4 oz (142.157 kg)      Elevated triglycerides, LDL at goal <130, low HDL. On fish oil.  Trig elevated, low HDL. Lab Results  Component Value Date   CHOL 197 10/15/2013   HDL 34.80* 10/15/2013   LDLCALC 123* 10/15/2013   TRIG 194.0* 10/15/2013   CHOLHDL 6 10/15/2013    Review of Systems  Constitutional: Negative for fever and fatigue.  HENT: Negative for ear pain.  Eyes: Negative for pain.  Respiratory: Negative for chest tightness and shortness of breath.  Cardiovascular: Negative for chest pain, palpitations and leg swelling.  Gastrointestinal: Negative for abdominal pain.  Genitourinary: Negative for dysuria.  Objective:   Physical Exam  Constitutional: Vital signs are normal. She appears well-developed and well-nourished. She is cooperative. Non-toxic appearance. She does not appear ill. No distress.  HENT:  Head: Normocephalic.  Right Ear: Hearing, tympanic membrane, external ear and ear canal normal. Tympanic membrane is not erythematous, not retracted and not  bulging.  Left Ear: Hearing, tympanic membrane, external ear and ear canal normal. Tympanic membrane is not erythematous, not retracted and not bulging.  Nose: No mucosal edema or rhinorrhea. Right sinus exhibits no maxillary sinus tenderness and no frontal sinus tenderness. Left sinus exhibits no maxillary sinus tenderness and no frontal sinus tenderness.  Mouth/Throat: Uvula is midline, oropharynx is clear and moist and mucous membranes are normal.  Eyes: Conjunctivae, EOM and lids are normal. Pupils are equal, round, and reactive to light. No foreign bodies found.  Neck: Trachea normal and normal range of motion. Neck supple. Carotid bruit is not present. No mass and no thyromegaly present.  Cardiovascular: Normal rate, regular rhythm, S1 normal, S2 normal, normal heart sounds, intact distal pulses and normal pulses. Exam reveals no gallop and no friction rub.  No murmur heard.  Pulmonary/Chest: Effort normal and breath sounds normal. Not tachypneic. No respiratory distress. She has no decreased breath sounds. She has no wheezes. She has no rhonchi. She has no rales.  Abdominal: Soft. Normal appearance and bowel sounds are normal. There is no tenderness.  Neurological: She is alert.  Skin: Skin is warm, dry and intact. No rash noted. Psychiatric: Her speech is normal and behavior is normal. Judgment and thought content normal. Her mood appears not anxious. Cognition and memory are normal. She does not exhibit a depressed mood.  Assessment & Plan:   The patient's preventative maintenance and recommended screening tests for an annual wellness exam were reviewed in full today.  Brought up to date unless  services declined.  Counselled on the importance of diet, exercise, and its role in overall health and mortality.  The patient's FH and SH was reviewed, including their home life, tobacco status, and drug and alcohol status.   Vaccines: Uptodate with Tdap, shingles vaccine. Given flu today. Mammo:  nml in 09/17/2013 pap/dve :  Pap nml 04/2012, She has never had an abnormal but last one prior was years ago about 2005.  Will plan getting 3 in a row then spacing or stopping after age 38.  Yearly DVE. Colon: No family history of colon cancer known. Plan ifob for now.  Never brought back in last year.  DEXA: never has had DEXA, mother with ? osteoporosis

## 2013-10-20 NOTE — Assessment & Plan Note (Signed)
Low carb diet. INcfor given on diet changes and weight loss.

## 2013-10-20 NOTE — Assessment & Plan Note (Signed)
At goal , slightly low at home. HAs white coat HTN

## 2013-10-22 ENCOUNTER — Encounter: Payer: Self-pay | Admitting: *Deleted

## 2013-10-22 LAB — CYTOLOGY - PAP

## 2013-10-27 ENCOUNTER — Other Ambulatory Visit (INDEPENDENT_AMBULATORY_CARE_PROVIDER_SITE_OTHER): Payer: BC Managed Care – PPO

## 2013-10-27 DIAGNOSIS — Z1211 Encounter for screening for malignant neoplasm of colon: Secondary | ICD-10-CM

## 2013-10-28 LAB — FECAL OCCULT BLOOD, IMMUNOCHEMICAL: Fecal Occult Bld: NEGATIVE

## 2013-11-04 ENCOUNTER — Ambulatory Visit
Admission: RE | Admit: 2013-11-04 | Discharge: 2013-11-04 | Disposition: A | Discharge status: Home / Self Care | Payer: BC Managed Care – PPO | Source: Ambulatory Visit | Attending: Family Medicine | Admitting: Family Medicine

## 2013-11-04 DIAGNOSIS — E2839 Other primary ovarian failure: Secondary | ICD-10-CM

## 2014-01-22 ENCOUNTER — Ambulatory Visit: Payer: BC Managed Care – PPO | Admitting: Family Medicine

## 2014-07-30 ENCOUNTER — Other Ambulatory Visit: Payer: Self-pay | Admitting: Family Medicine

## 2014-08-09 ENCOUNTER — Other Ambulatory Visit: Payer: Self-pay | Admitting: Family Medicine

## 2014-09-13 IMAGING — US US BREAST*L*
1 series · 7 of 7 positions shown · non-contrast
Comparison: Previous mammograms.

CLINICAL DATA: Three month followup of a probably benign left
breast asymmetry.

EXAM:
DIGITAL DIAGNOSTIC  LEFT MAMMOGRAM WITH CAD
ULTRASOUND LEFT BREAST

[Series 1: breast · 7 of 7 slices shown]
[im 1/7]
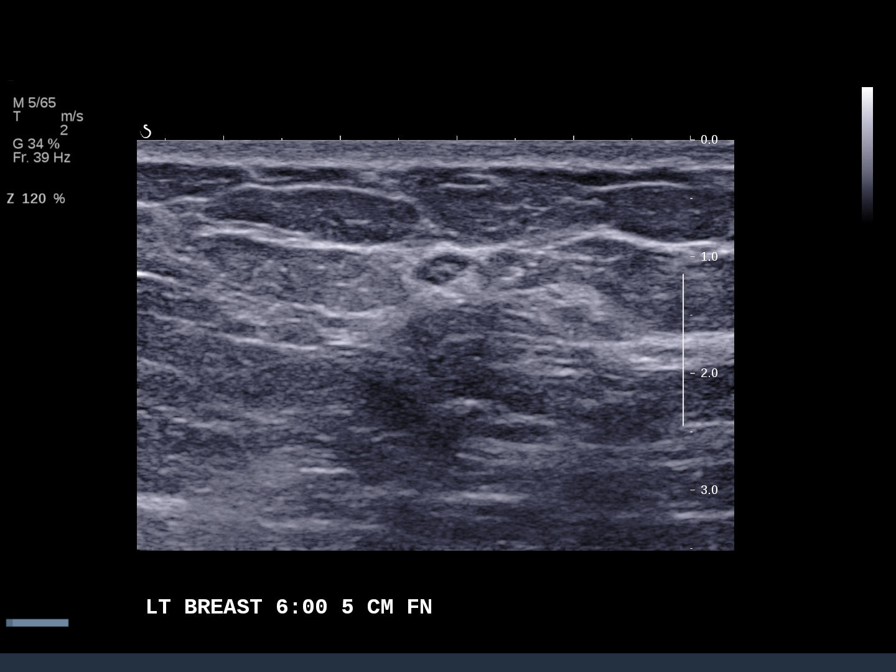
[im 2/7]
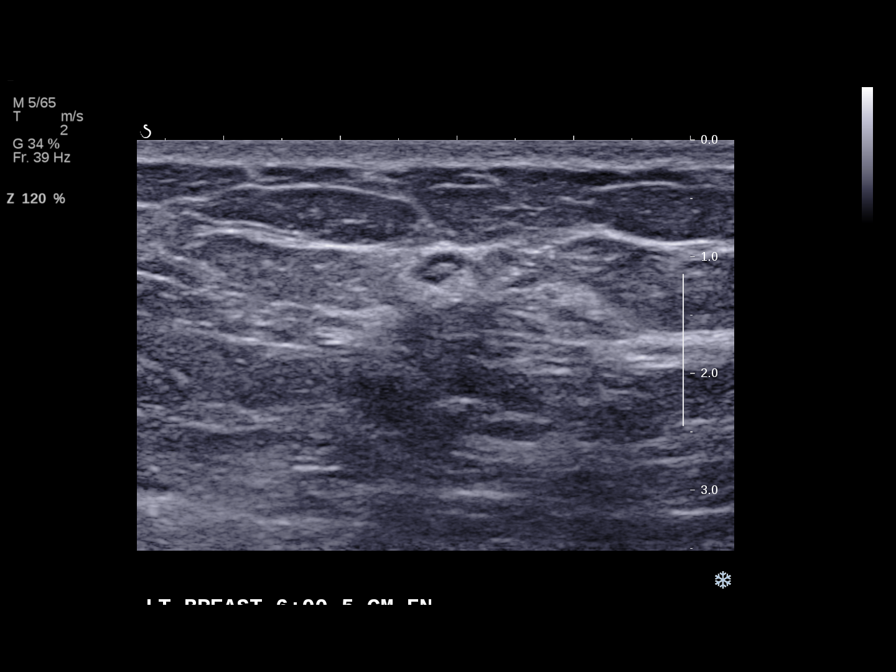
[im 3/7]
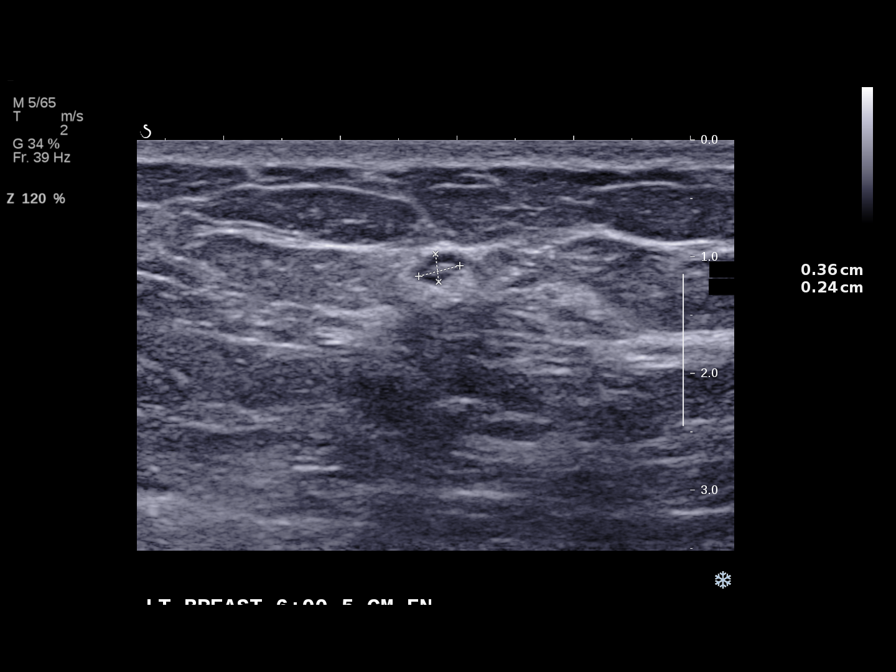
[im 4/7]
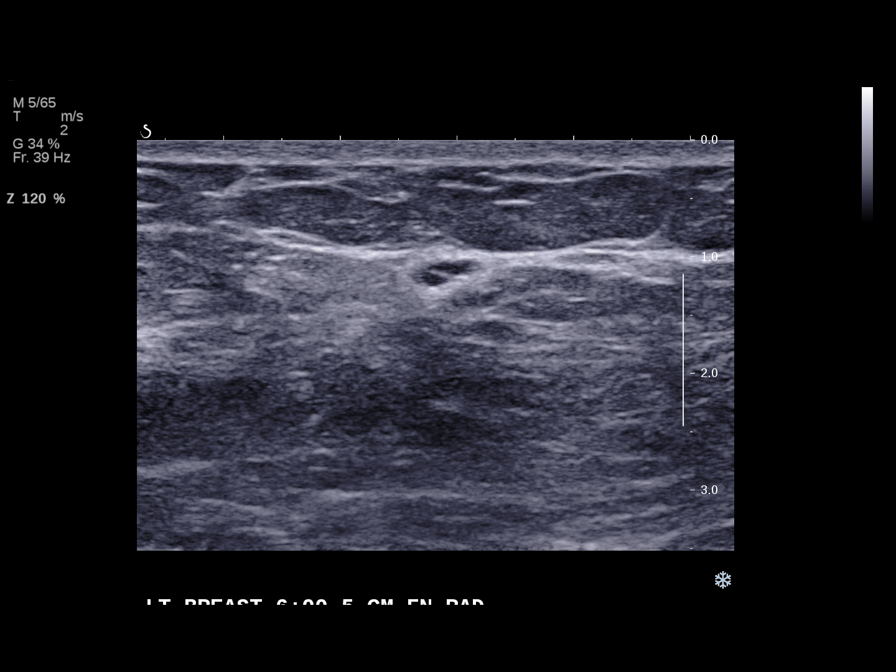
[im 5/7]
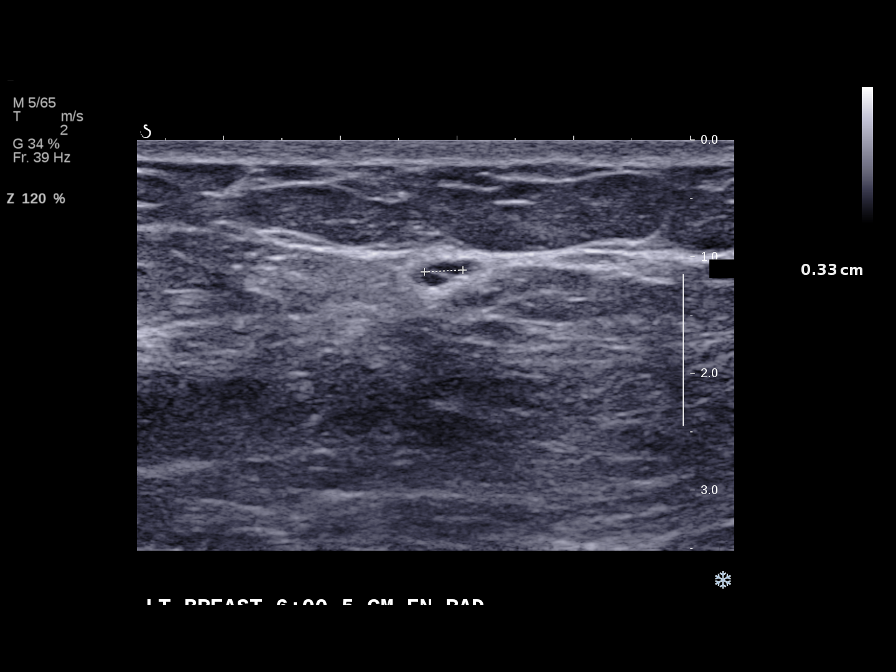
[im 6/7]
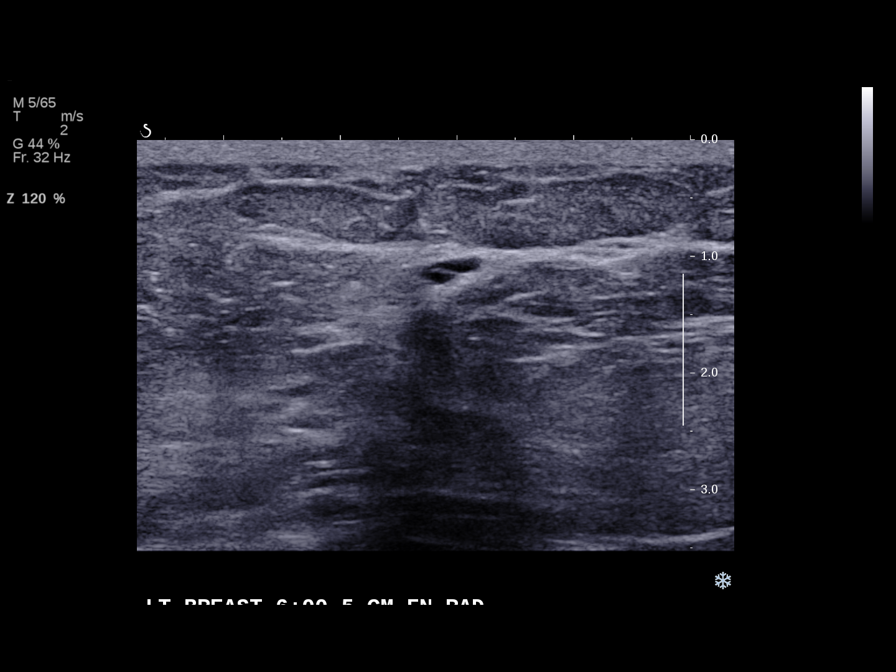
[im 7/7]
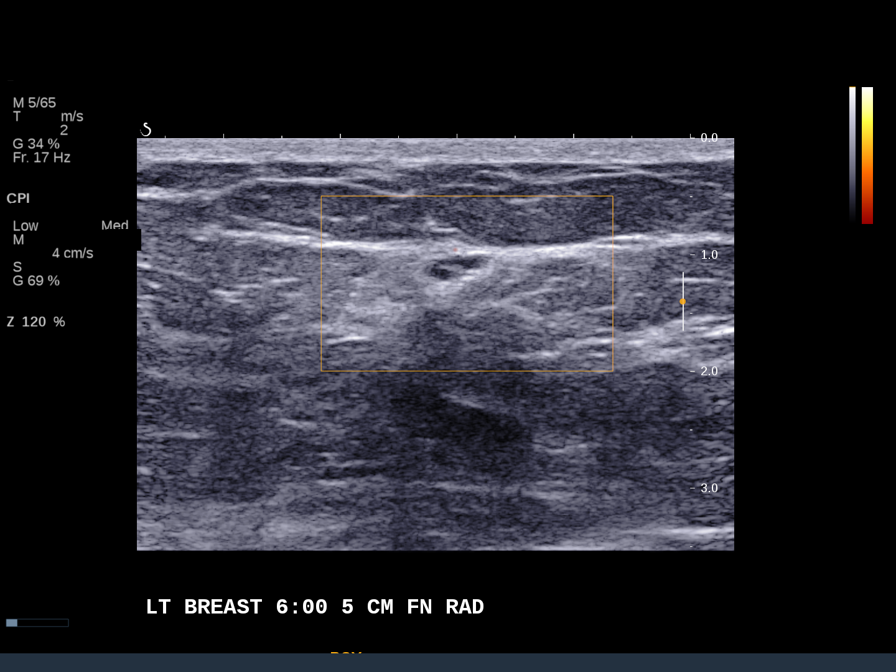

[7 of 7 positions shown; findings below may reference images not displayed]

ACR Breast Density Category b: There are scattered areas of
fibroglandular density.
FINDINGS: There is a stable asymmetry in the central to slightly inferior left
breast at the 6 o'clock position with mildly lobulated margins
measuring approximately 5 mm. Mammographic images were processed
with CAD.

PHYSICAL EXAMINATION of the inferior left breast does not reveal any
palpable masses.

Targeted ultrasound of the left breast was performed demonstrating a
small lymph node containing an echogenic/ fatty hila at 6 o'clock
4-5 cm from the nipple measuring 0.4 x 0.2 by 0.3 cm. This
corresponds with mammography findings.
IMPRESSION: Benign lymph node in the left breast seen on targeted ultrasound
which corresponds with mammography findings. There is no
mammographic evidence of malignancy in the left breast.

RECOMMENDATION:
Recommend return to routine screening mammography, for which the
patient will be due August 2013.

I have discussed the findings and recommendations with the patient.
Results were also provided in writing at the conclusion of the
visit.

BI-RADS CATEGORY  2: Benign Finding(s)

## 2014-10-11 ENCOUNTER — Other Ambulatory Visit: Payer: Self-pay

## 2014-10-11 DIAGNOSIS — Z1231 Encounter for screening mammogram for malignant neoplasm of breast: Secondary | ICD-10-CM

## 2014-10-13 ENCOUNTER — Telehealth: Payer: Self-pay | Admitting: Family Medicine

## 2014-10-13 DIAGNOSIS — E781 Pure hyperglyceridemia: Secondary | ICD-10-CM

## 2014-10-13 DIAGNOSIS — R7303 Prediabetes: Secondary | ICD-10-CM

## 2014-10-13 NOTE — Telephone Encounter (Signed)
-----   Message from [name] sent at 10/04/2014  4:29 PM EDT ----- Regarding: Lab orders for Friday. 9.30.16 Patient is scheduled for CPX labs, please order future labs, Thanks , Karna Christmas

## 2014-10-15 ENCOUNTER — Other Ambulatory Visit: Payer: BC Managed Care – PPO

## 2014-10-18 ENCOUNTER — Ambulatory Visit
Admission: RE | Admit: 2014-10-18 | Discharge: 2014-10-18 | Disposition: A | Discharge status: Home / Self Care | Payer: BLUE CROSS/BLUE SHIELD | Source: Ambulatory Visit

## 2014-10-18 DIAGNOSIS — Z1231 Encounter for screening mammogram for malignant neoplasm of breast: Secondary | ICD-10-CM

## 2014-10-22 ENCOUNTER — Encounter: Payer: BC Managed Care – PPO | Admitting: Family Medicine

## 2014-10-24 ENCOUNTER — Other Ambulatory Visit: Payer: Self-pay | Admitting: Family Medicine

## 2014-11-06 ENCOUNTER — Other Ambulatory Visit: Payer: Self-pay | Admitting: Family Medicine

## 2014-11-11 ENCOUNTER — Ambulatory Visit (INDEPENDENT_AMBULATORY_CARE_PROVIDER_SITE_OTHER): Payer: BLUE CROSS/BLUE SHIELD

## 2014-11-11 DIAGNOSIS — Z23 Encounter for immunization: Secondary | ICD-10-CM | POA: Diagnosis not present

## 2014-12-30 ENCOUNTER — Telehealth: Payer: Self-pay | Admitting: Family Medicine

## 2014-12-30 DIAGNOSIS — Z1159 Encounter for screening for other viral diseases: Secondary | ICD-10-CM

## 2014-12-30 NOTE — Telephone Encounter (Signed)
-----   Message from [name] sent at 12/22/2014  4:57 PM EST ----- Regarding: lab orders for Friday, 12.16.16 Patient is scheduled for CPX labs, please order future labs, Thanks , Terri  There are 3 orders pending from Sept. wasn't sure if you wanted to add anymore.

## 2014-12-31 ENCOUNTER — Other Ambulatory Visit: Payer: Self-pay | Admitting: Family Medicine

## 2014-12-31 ENCOUNTER — Other Ambulatory Visit (INDEPENDENT_AMBULATORY_CARE_PROVIDER_SITE_OTHER): Payer: BLUE CROSS/BLUE SHIELD

## 2014-12-31 DIAGNOSIS — Z1159 Encounter for screening for other viral diseases: Secondary | ICD-10-CM

## 2014-12-31 DIAGNOSIS — R7303 Prediabetes: Secondary | ICD-10-CM

## 2014-12-31 DIAGNOSIS — E781 Pure hyperglyceridemia: Secondary | ICD-10-CM

## 2014-12-31 LAB — LIPID PANEL
CHOL/HDL RATIO: 5
Cholesterol: 195 mg/dL (ref 0–200)
HDL: 40.8 mg/dL (ref >39.00)
LDL Cholesterol: 123 mg/dL — ABNORMAL HIGH (ref 0–99)
NONHDL: 154.05
Triglycerides: 156 mg/dL — ABNORMAL HIGH (ref 0.0–149.0)
VLDL: 31.2 mg/dL (ref 0.0–40.0)

## 2014-12-31 LAB — COMPREHENSIVE METABOLIC PANEL
ALT: 23 U/L (ref 0–35)
AST: 18 U/L (ref 0–37)
Albumin: 4.1 g/dL (ref 3.5–5.2)
Alkaline Phosphatase: 71 U/L (ref 39–117)
BUN: 17 mg/dL (ref 6–23)
CALCIUM: 9.9 mg/dL (ref 8.4–10.5)
CHLORIDE: 102 meq/L (ref 96–112)
CO2: 26 meq/L (ref 19–32)
Creatinine, Ser: 0.95 mg/dL (ref 0.40–1.20)
GFR: 62.88 mL/min (ref >60.00)
Glucose, Bld: 114 mg/dL — ABNORMAL HIGH (ref 70–99)
POTASSIUM: 3.9 meq/L (ref 3.5–5.1)
Sodium: 137 mEq/L (ref 135–145)
Total Bilirubin: 0.3 mg/dL (ref 0.2–1.2)
Total Protein: 7.6 g/dL (ref 6.0–8.3)

## 2014-12-31 LAB — HEMOGLOBIN A1C: HEMOGLOBIN A1C: 6 % (ref 4.6–6.5)

## 2015-01-01 LAB — HEPATITIS C ANTIBODY: HCV Ab: NEGATIVE

## 2015-01-06 ENCOUNTER — Encounter: Payer: Self-pay | Admitting: Family Medicine

## 2015-01-06 ENCOUNTER — Ambulatory Visit (INDEPENDENT_AMBULATORY_CARE_PROVIDER_SITE_OTHER): Payer: BLUE CROSS/BLUE SHIELD | Admitting: Family Medicine

## 2015-01-06 ENCOUNTER — Other Ambulatory Visit (HOSPITAL_COMMUNITY)
Admission: RE | Admit: 2015-01-06 | Discharge: 2015-01-06 | Disposition: A | Discharge status: Home / Self Care | Payer: BLUE CROSS/BLUE SHIELD | Source: Ambulatory Visit | Attending: Family Medicine | Admitting: Family Medicine

## 2015-01-06 VITALS — BP 162/64 | HR 87 | Temp 98.7°F | Ht 67.5 in | Wt 335.5 lb

## 2015-01-06 DIAGNOSIS — Z1211 Encounter for screening for malignant neoplasm of colon: Secondary | ICD-10-CM

## 2015-01-06 DIAGNOSIS — R7303 Prediabetes: Secondary | ICD-10-CM

## 2015-01-06 DIAGNOSIS — Z1151 Encounter for screening for human papillomavirus (HPV): Secondary | ICD-10-CM | POA: Diagnosis present

## 2015-01-06 DIAGNOSIS — E669 Obesity, unspecified: Secondary | ICD-10-CM | POA: Insufficient documentation

## 2015-01-06 DIAGNOSIS — Z01419 Encounter for gynecological examination (general) (routine) without abnormal findings: Secondary | ICD-10-CM | POA: Diagnosis present

## 2015-01-06 DIAGNOSIS — Z0000 Encounter for general adult medical examination without abnormal findings: Secondary | ICD-10-CM

## 2015-01-06 DIAGNOSIS — Z6841 Body Mass Index (BMI) 40.0 and over, adult: Secondary | ICD-10-CM

## 2015-01-06 DIAGNOSIS — Z124 Encounter for screening for malignant neoplasm of cervix: Secondary | ICD-10-CM

## 2015-01-06 DIAGNOSIS — E781 Pure hyperglyceridemia: Secondary | ICD-10-CM

## 2015-01-06 DIAGNOSIS — I10 Essential (primary) hypertension: Secondary | ICD-10-CM | POA: Diagnosis not present

## 2015-01-06 MED ORDER — AMLODIPINE BESYLATE 10 MG PO TABS: 10.0000 mg | ORAL_TABLET | Freq: Every day | ORAL | Status: DC

## 2015-01-06 NOTE — Assessment & Plan Note (Signed)
Inadequate control. Increase amlodipine to 10 mg daily. Follow BPs at home and call with measurements.  Low salt diet.

## 2015-01-06 NOTE — Assessment & Plan Note (Signed)
Encouraged exercise, weight loss, healthy eating habits. She will consider nutrition referral.

## 2015-01-06 NOTE — Progress Notes (Signed)
64 year old female presents for annual exam.   Hypertension: good control at home on lisinopril/ HCTZ 2 tabs daily, amlodipine 5 mg daily. She has an element of white coat HTN. BP Readings from Last 3 Encounters:  01/06/15 162/64  10/20/13 140/58  08/04/13 140/60  Using medication without problems or lightheadedness: None  Chest pain with exertion:None  Edema:occ in summer Short of breath:some due to weight and deconditioning.  Average home BPs: 150/79 She  Has noted BP being elevated more lately. Other issues:   Prediabetes.  Lab Results  Component Value Date   HGBA1C 6.0 12/31/2014   Moderate diet control, does good for a while then stops. Has been trying to increase water in diet. Exercise: She is walking more, has fit bit. Has had some weight gain. Wt Readings from Last 3 Encounters:  01/06/15 335 lb 8 oz (152.182 kg)  10/20/13 332 lb 12 oz (150.934 kg)  08/04/13 329 lb 4 oz (149.347 kg)  Body mass index is 51.74 kg/(m^2).   Elevated triglycerides, LDL at goal <130, low HDL. On fish oil. Trig elevated, low HDL. Lab Results  Component Value Date   CHOL 195 12/31/2014   HDL 40.80 12/31/2014   LDLCALC 123* 12/31/2014   TRIG 156.0* 12/31/2014   CHOLHDL 5 12/31/2014   Review of Systems  Constitutional: Negative for fever and fatigue.  HENT: Negative for ear pain.  Eyes: Negative for pain.  Respiratory: Negative for chest tightness and shortness of breath.  Cardiovascular: Negative for chest pain, palpitations and leg swelling.  Gastrointestinal: Negative for abdominal pain.  Genitourinary: Negative for dysuria.  Objective:   Physical Exam  Constitutional: Vital signs are normal. She appears well-developed and well-nourished. She is cooperative. Non-toxic appearance. She does not appear ill. No distress.  HENT:  Head: Normocephalic.  Right Ear: Hearing, tympanic membrane, external ear and ear canal normal. Tympanic membrane is not erythematous, not  retracted and not bulging.  Left Ear: Hearing, tympanic membrane, external ear and ear canal normal. Tympanic membrane is not erythematous, not retracted and not bulging.  Nose: No mucosal edema or rhinorrhea. Right sinus exhibits no maxillary sinus tenderness and no frontal sinus tenderness. Left sinus exhibits no maxillary sinus tenderness and no frontal sinus tenderness.  Mouth/Throat: Uvula is midline, oropharynx is clear and moist and mucous membranes are normal.  Eyes: Conjunctivae, EOM and lids are normal. Pupils are equal, round, and reactive to light. No foreign bodies found.  Neck: Trachea normal and normal range of motion. Neck supple. Carotid bruit is not present. No mass and no thyromegaly present.  Cardiovascular: Normal rate, regular rhythm, S1 normal, S2 normal, normal heart sounds, intact distal pulses and normal pulses. Exam reveals no gallop and no friction rub.  No murmur heard.  Pulmonary/Chest: Effort normal and breath sounds normal. Not tachypneic. No respiratory distress. She has no decreased breath sounds. She has no wheezes. She has no rhonchi. She has no rales.  Abdominal: Soft. Normal appearance and bowel sounds are normal. There is no tenderness.  Neurological: She is alert.  Skin: Skin is warm, dry and intact. No rash noted. Psychiatric: Her speech is normal and behavior is normal. Judgment and thought content normal. Her mood appears not anxious. Cognition and memory are normal. She does not exhibit a depressed mood.  Assessment & Plan:   The patient's preventative maintenance and recommended screening tests for an annual wellness exam were reviewed in full today.  Brought up to date unless services declined.  Counselled  on the importance of diet, exercise, and its role in overall health and mortality.  The patient's FH and SH was reviewed, including their home life, tobacco status, and drug and alcohol status.   Vaccines: Uptodate with Tdap, shingles  vaccine, flu Mammo: nml in 10/2014 pap/dve : Pap nml  04/2012 and 10/2013, She has never had an abnormal but last one prior was years ago about 2005. Plan stopping after age 60. Yearly DVE. Colon: No family history of colon cancer known. Plan ifob for now.  DEXA: 10/2103 normal.. Repeat in 5 years.

## 2015-01-06 NOTE — Progress Notes (Signed)
Pre visit review using our clinic review tool, if applicable. No additional management support is needed unless otherwise documented below in the visit note. 

## 2015-01-06 NOTE — Assessment & Plan Note (Signed)
Improved. Continue fish oil.

## 2015-01-06 NOTE — Assessment & Plan Note (Signed)
Work on low carb diet and lifestyle changes.

## 2015-01-06 NOTE — Patient Instructions (Addendum)
Work on exercise, weight loss and healthy eating. Decrease portion size.  Call if interested in nutrition referral.  Look into water exercise.  Make appt for following up weight management in next 3 months 30 min OV. Stop at lab to pick up stool test.  Increase amlodipine to 10 mg daily.  Call with BP measurements in next few weeks.

## 2015-01-07 LAB — CYTOLOGY - PAP

## 2015-01-11 ENCOUNTER — Encounter: Payer: Self-pay | Admitting: *Deleted

## 2015-02-03 ENCOUNTER — Other Ambulatory Visit: Payer: Self-pay | Admitting: Family Medicine

## 2015-06-02 ENCOUNTER — Other Ambulatory Visit: Payer: Self-pay | Admitting: Family Medicine

## 2015-08-05 ENCOUNTER — Telehealth: Payer: Self-pay

## 2015-08-05 ENCOUNTER — Encounter: Payer: Self-pay | Admitting: Family Medicine

## 2015-08-05 ENCOUNTER — Ambulatory Visit (INDEPENDENT_AMBULATORY_CARE_PROVIDER_SITE_OTHER): Payer: No Typology Code available for payment source | Admitting: Family Medicine

## 2015-08-05 VITALS — BP 130/60 | HR 89 | Temp 99.0°F | Ht 67.5 in | Wt 342.0 lb

## 2015-08-05 DIAGNOSIS — M79674 Pain in right toe(s): Secondary | ICD-10-CM

## 2015-08-05 DIAGNOSIS — M10071 Idiopathic gout, right ankle and foot: Secondary | ICD-10-CM

## 2015-08-05 MED ORDER — INDOMETHACIN 50 MG PO CAPS: 50.0000 mg | ORAL_CAPSULE | Freq: Three times a day (TID) | ORAL | Status: DC

## 2015-08-05 MED ORDER — COLCHICINE 0.6 MG PO TABS: 0.6000 mg | ORAL_TABLET | Freq: Two times a day (BID) | ORAL | Status: DC

## 2015-08-05 NOTE — Addendum Note (Signed)
Addended by: Carter Kitten on: 08/05/2015 04:52 PM   Modules accepted: Orders, Medications

## 2015-08-05 NOTE — Telephone Encounter (Signed)
Pt left v/m; pt was seen earlier today and colchicine is not covered by ins and cost to pt is $350.00; pt request substitute med to [facility]. Pt request cb.

## 2015-08-05 NOTE — Progress Notes (Signed)
Pre visit review using our clinic review tool, if applicable. No additional management support is needed unless otherwise documented below in the visit note. 

## 2015-08-05 NOTE — Progress Notes (Signed)
Dr. Frederico Hamman T. Reyhan Moronta, MD, [facility] [place], [address] Phone: [phone] Fax: [phone]  08/05/2015  Patient: Cassie Nelson, MRN: [id number], DOB: 26-Feb-1950, 65 y.o.  Primary Physician:  Eliezer Lofts, MD   Chief Complaint  Patient presents with  . Foot Pain    Right-Red and Swollen   Subjective:   Cassie Nelson is a 65 y.o. very pleasant female patient who presents with the following:  R 4th toe Gout  For the last 7 days, the patient has had redness, warmth, and pain in the fourth digit without any trauma.  She was having some difficulty even with her sheets laying on her foot.  She never has had a history of gout or pseudogout in the past that she can recall.  She has no systemic fever and there is no tear or injury wound in the foot.  Past Medical History, Surgical History, Social History, Family History, Problem List, Medications, and Allergies have been reviewed and updated if relevant.  Patient Active Problem List   Diagnosis Date Noted  . Morbid obesity with body mass index (BMI) of 50.0 to 59.9 in adult [facility]) 01/06/2015  . Metabolic syndrome 99991111  . Diverticulosis 10/20/2013  . Hypertriglyceridemia 06/20/2012  . Prediabetes 06/20/2012  . Stress incontinence 05/22/2012  . HTN (hypertension) 05/14/2012    Past Medical History  Diagnosis Date  . Diverticular disease   . Hx of diverticulitis of colon 2009, 2010  . Hypertension   . Shortness of breath     on exertion    Past Surgical History  Procedure Laterality Date  . Tonsillectomy    . 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 Mouth surgery      biopsy of cheek  . Dilitation & currettage/hystroscopy with novasure ablation N/A 07/03/2012    Procedure: DILATATION & CURETTAGE/HYSTEROSCOPY WITH NOVASURE ABLATION;  Surgeon: Lavonia Drafts, MD;  Location: [facility];  Service: Gynecology;  Laterality: N/A;     Social History   Social History  . Marital Status: Single    Spouse Name: N/A  . Number of Children: 0  . Years of Education: N/A   Occupational History  . sales rep    Social History Main Topics  . Smoking status: Former Smoker -- 25 years    Quit date: 06/22/2010  . Smokeless tobacco: Former Systems developer    Quit date: 09/23/2010  . Alcohol Use: 0.5 oz/week    1 drink(s) per week     Comment: occasionally  . Drug Use: No  . Sexual Activity: No   Other Topics Concern  . Not on file   Social History Narrative   Single, No kids.   Works as a Orthoptist for [employer].    Family History  Problem Relation Age of Onset  . Heart disease Father   . Cancer Father 54    colon cancer  . Arthritis Father   . Hypertension Father   . Hypothyroidism Father   . Hypertension Mother   . Hypothyroidism Mother     No Known Allergies  Medication list reviewed and updated in full in [facility].  GEN: No fevers, chills. Nontoxic. Primarily MSK c/o today. MSK: Detailed in the HPI GI: tolerating PO intake without difficulty Neuro: No numbness, parasthesias, or tingling associated. Otherwise the pertinent positives of the ROS are noted above.   Objective:   BP  130/60 mmHg  Pulse 89  Temp(Src) 99 F (37.2 C) (Oral)  Ht 5' 7.5" (1.715 m)  Wt 342 lb (155.13 kg)  BMI 52.74 kg/m2   GEN: WDWN, NAD, Non-toxic, Alert & Oriented x 3 HEENT: Atraumatic, Normocephalic.  Ears and Nose: No external deformity. EXTR: No clubbing/cyanosis/edema NEURO: Normal gait.  PSYCH: Normally interactive. Conversant. Not depressed or anxious appearing.  Calm demeanor.    Fourth digit on the right toe is red, slightly warm and tender to palpation.  The surrounding foot has trace edema only.  Radiology: No results found.  Assessment and Plan:   Acute idiopathic gout involving toe of right foot  Toe pain, right  T.i.d. Indomethacin.   Also tried to give the patient some  cultures seen, but her insurance would not cover this and  She could not fill it due to financial reasons.  Follow-up if not improved by early next week.  Follow-up: No Follow-up on file.  New Prescriptions   INDOMETHACIN (INDOCIN) 50 MG CAPSULE    Take 1 capsule (50 mg total) by mouth 3 (three) times daily with meals.   Signed,  Maud Deed. Kairon Shock, MD   Patient's Medications  New Prescriptions   INDOMETHACIN (INDOCIN) 50 MG CAPSULE    Take 1 capsule (50 mg total) by mouth 3 (three) times daily with meals.  Previous Medications   AMLODIPINE (NORVASC) 10 MG TABLET    Take 1 tablet (10 mg total) by mouth daily.   CALCIUM-VITAMIN D PO    Take 1 tablet by mouth daily.   FISH OIL-OMEGA-3 FATTY ACIDS 1000 MG CAPSULE    Take 2 g by mouth daily.   LISINOPRIL-HYDROCHLOROTHIAZIDE (PRINZIDE,ZESTORETIC) 20-12.5 MG TABLET    TAKE 2 TABLETS BY MOUTH DAILY.   MULTIPLE VITAMIN (MULTIVITAMIN) TABLET    Take 1 tablet by mouth daily.   PROBIOTIC PRODUCT (PROBIOTIC PO)    Take 1 capsule by mouth daily.  Modified Medications   No medications on file  Discontinued Medications   No medications on file

## 2015-08-05 NOTE — Telephone Encounter (Addendum)
Arayla notified as instructed by telephone.  Medication list updated.

## 2015-08-05 NOTE — Telephone Encounter (Signed)
Use the indomethacin alone as written There is no substitute

## 2015-10-18 ENCOUNTER — Other Ambulatory Visit: Payer: Self-pay | Admitting: Family Medicine

## 2015-10-18 DIAGNOSIS — Z1231 Encounter for screening mammogram for malignant neoplasm of breast: Secondary | ICD-10-CM

## 2015-11-07 ENCOUNTER — Ambulatory Visit
Admission: RE | Admit: 2015-11-07 | Discharge: 2015-11-07 | Disposition: A | Discharge status: Home / Self Care | Payer: No Typology Code available for payment source | Source: Ambulatory Visit | Attending: Family Medicine | Admitting: Family Medicine

## 2015-11-07 DIAGNOSIS — Z1231 Encounter for screening mammogram for malignant neoplasm of breast: Secondary | ICD-10-CM

## 2015-11-24 ENCOUNTER — Encounter: Payer: Self-pay | Admitting: *Deleted

## 2015-11-24 ENCOUNTER — Other Ambulatory Visit: Payer: Self-pay | Admitting: Family Medicine

## 2016-01-20 ENCOUNTER — Other Ambulatory Visit: Payer: Self-pay | Admitting: Family Medicine

## 2016-02-16 ENCOUNTER — Other Ambulatory Visit: Payer: Self-pay | Admitting: Family Medicine

## 2016-02-16 NOTE — Telephone Encounter (Signed)
Last office visit 08/05/2015 with Dr. Lorelei Pont for Gout.  Last CPE 01/06/2015.  Letter mailed on 12/04/2015 for patient to call the office and schedule CPE.  Last refilled also stated patient needed to call and schedule office visit.  No future appointments.  Refill?

## 2016-02-17 NOTE — Telephone Encounter (Signed)
Needs cpx refill until then.

## 2016-02-17 NOTE — Telephone Encounter (Signed)
CPE scheduled for 03/09/2016 at 9:00 am with fasting labs scheduled for 03/02/2016 at 8:30 am.

## 2016-03-01 ENCOUNTER — Telehealth: Payer: Self-pay | Admitting: Family Medicine

## 2016-03-01 DIAGNOSIS — E781 Pure hyperglyceridemia: Secondary | ICD-10-CM

## 2016-03-01 DIAGNOSIS — R7303 Prediabetes: Secondary | ICD-10-CM

## 2016-03-01 NOTE — Telephone Encounter (Signed)
-----   Message from [name] sent at 02/24/2016 10:20 AM EST ----- Regarding: Lab orders for Friday, 2.16.18 Patient is scheduled for CPX labs, please order future labs, Thanks , Karna Christmas

## 2016-03-02 ENCOUNTER — Other Ambulatory Visit (INDEPENDENT_AMBULATORY_CARE_PROVIDER_SITE_OTHER): Payer: No Typology Code available for payment source

## 2016-03-02 DIAGNOSIS — R7303 Prediabetes: Secondary | ICD-10-CM | POA: Diagnosis not present

## 2016-03-02 DIAGNOSIS — E781 Pure hyperglyceridemia: Secondary | ICD-10-CM | POA: Diagnosis not present

## 2016-03-02 LAB — COMPREHENSIVE METABOLIC PANEL
ALK PHOS: 64 U/L (ref 39–117)
ALT: 23 U/L (ref 0–35)
AST: 19 U/L (ref 0–37)
Albumin: 4.4 g/dL (ref 3.5–5.2)
BUN: 19 mg/dL (ref 6–23)
CALCIUM: 9.7 mg/dL (ref 8.4–10.5)
CO2: 26 meq/L (ref 19–32)
Chloride: 101 mEq/L (ref 96–112)
Creatinine, Ser: 0.96 mg/dL (ref 0.40–1.20)
GFR: 61.9 mL/min (ref >60.00)
GLUCOSE: 101 mg/dL — AB (ref 70–99)
POTASSIUM: 3.9 meq/L (ref 3.5–5.1)
Sodium: 136 mEq/L (ref 135–145)
Total Bilirubin: 0.4 mg/dL (ref 0.2–1.2)
Total Protein: 7.7 g/dL (ref 6.0–8.3)

## 2016-03-02 LAB — LIPID PANEL
CHOL/HDL RATIO: 5
Cholesterol: 185 mg/dL (ref 0–200)
HDL: 38.8 mg/dL — AB (ref >39.00)
LDL Cholesterol: 110 mg/dL — ABNORMAL HIGH (ref 0–99)
NONHDL: 145.72
Triglycerides: 178 mg/dL — ABNORMAL HIGH (ref 0.0–149.0)
VLDL: 35.6 mg/dL (ref 0.0–40.0)

## 2016-03-02 LAB — HEMOGLOBIN A1C: Hgb A1c MFr Bld: 6 % (ref 4.6–6.5)

## 2016-03-09 ENCOUNTER — Encounter: Payer: Self-pay | Admitting: Family Medicine

## 2016-03-09 ENCOUNTER — Ambulatory Visit (INDEPENDENT_AMBULATORY_CARE_PROVIDER_SITE_OTHER): Payer: No Typology Code available for payment source | Admitting: Family Medicine

## 2016-03-09 VITALS — BP 158/60 | HR 76 | Temp 98.5°F | Ht 67.5 in | Wt 336.2 lb

## 2016-03-09 DIAGNOSIS — Z6841 Body Mass Index (BMI) 40.0 and over, adult: Secondary | ICD-10-CM | POA: Diagnosis not present

## 2016-03-09 DIAGNOSIS — Z23 Encounter for immunization: Secondary | ICD-10-CM

## 2016-03-09 DIAGNOSIS — Z1211 Encounter for screening for malignant neoplasm of colon: Secondary | ICD-10-CM | POA: Diagnosis not present

## 2016-03-09 DIAGNOSIS — E7800 Pure hypercholesterolemia, unspecified: Secondary | ICD-10-CM | POA: Diagnosis not present

## 2016-03-09 DIAGNOSIS — I10 Essential (primary) hypertension: Secondary | ICD-10-CM

## 2016-03-09 DIAGNOSIS — R7303 Prediabetes: Secondary | ICD-10-CM | POA: Diagnosis not present

## 2016-03-09 DIAGNOSIS — Z0000 Encounter for general adult medical examination without abnormal findings: Secondary | ICD-10-CM | POA: Diagnosis not present

## 2016-03-09 NOTE — Assessment & Plan Note (Signed)
Low carb diet 

## 2016-03-09 NOTE — Assessment & Plan Note (Signed)
Inadequate control but pt with  Element of white coat HTN. Follow at home and  May need toi increase med at next OV.

## 2016-03-09 NOTE — Assessment & Plan Note (Signed)
Counseled for 15 min of diet changes and weight loss.. Set out goal plan outlines in pt instructions.

## 2016-03-09 NOTE — Progress Notes (Signed)
Subjective:    Patient ID: Cassie Nelson, female    DOB: 09/20/1950, 66 y.o.   MRN: [id number]  HPI   The patient is here for annual wellness exam and preventative care.     She has been trying to work on weight loss.  She would like to come in more often for weight management evaluations.  She has tried to cook more at home, when traveling trying to make her own own food.  She has been trying to decrease snacking. Exercise: none Body mass index is 51.89 kg/m.  Wt Readings from Last 3 Encounters:  03/09/16 (!) 336 lb 4 oz (152.5 kg)  08/05/15 (!) 342 lb (155.1 kg)  01/06/15 (!) 335 lb 8 oz (152.2 kg)    Hypertension:    Inadequate control today on lisinopril HCTZ, amlodipine BP Readings from Last 3 Encounters:  03/09/16 (!) 158/60  08/05/15 130/60  01/06/15 (!) 162/64  Using medication without problems or lightheadedness:  None Chest pain with exertion:none Edema:occ Short of breath: yes Average home BPs: Has not ben following BPs at home. Other issues:  Elevated Cholesterol:  CVD 10 year risk 12.5%.Marland Kitchen Recommend statin initiation. She is hesitant. We will work on lifestlye changes. Lab Results  Component Value Date   CHOL 185 03/02/2016   HDL 38.80 (L) 03/02/2016   LDLCALC 110 (H) 03/02/2016   TRIG 178.0 (H) 03/02/2016   CHOLHDL 5 03/02/2016    prediabetes:  Lab Results  Component Value Date   HGBA1C 6.0 03/02/2016    Review of Systems  Constitutional: Negative for fatigue and fever.  HENT: Negative for congestion.   Eyes: Negative for pain.  Respiratory: Positive for shortness of breath. Negative for cough.   Cardiovascular: Negative for chest pain, palpitations and leg swelling.  Gastrointestinal: Negative for abdominal pain.  Genitourinary: Negative for dysuria and vaginal bleeding.  Musculoskeletal: Negative for back pain.  Neurological: Negative for syncope, light-headedness and headaches.  Psychiatric/Behavioral: Negative for dysphoric mood.         Objective:   Physical Exam  Constitutional: Vital signs are normal. She appears well-developed and well-nourished. She is cooperative.  Non-toxic appearance. She does not appear ill. No distress.  HENT:  Head: Normocephalic.  Right Ear: Hearing, tympanic membrane, external ear and ear canal normal.  Left Ear: Hearing, tympanic membrane, external ear and ear canal normal.  Nose: Nose normal.  Eyes: Conjunctivae, EOM and lids are normal. Pupils are equal, round, and reactive to light. Lids are everted and swept, no foreign bodies found.  Neck: Trachea normal and normal range of motion. Neck supple. Carotid bruit is not present. No thyroid mass and no thyromegaly present.  Cardiovascular: Normal rate, regular rhythm, S1 normal, S2 normal, normal heart sounds and intact distal pulses.  Exam reveals no gallop.   No murmur heard. Pulmonary/Chest: Effort normal and breath sounds normal. No respiratory distress. She has no wheezes. She has no rhonchi. She has no rales.  Abdominal: Soft. Normal appearance and bowel sounds are normal. She exhibits no distension, no fluid wave, no abdominal bruit and no mass. There is no hepatosplenomegaly. There is no tenderness. There is no rebound, no guarding and no CVA tenderness. No hernia.  Lymphadenopathy:    She has no cervical adenopathy.    She has no axillary adenopathy.  Neurological: She is alert. She has normal strength. No cranial nerve deficit or sensory deficit.  Skin: Skin is warm, dry and intact. No rash noted.  Psychiatric: Her speech is  normal and behavior is normal. Judgment normal. Her mood appears not anxious. Cognition and memory are normal. She does not exhibit a depressed mood.          Assessment & Plan:  The patient's preventative maintenance and recommended screening tests for an annual wellness exam were reviewed in full today. Brought up to date unless services declined.  Counselled on the importance of diet, exercise,  and its role in overall health and mortality. The patient's FH and SH was reviewed, including their home life, tobacco status, and drug and alcohol status.   Vaccines: Uptodate with Tdap, shingles vaccine, flu,  Due for prevnar 13 Mammo: nml in 10/2015 pap/dve : Pap nml 2014,2015,2016 She has never had an abnormal but last one prior was years ago about 2005. Plan stopping after age 72. No DVE given no family history of ovarian uterine cancer Colon: No family history of colon cancer known. Plan ifob for now.  DEXA: 10/2013 normal.. Repeat in 5 years. HIV: refused

## 2016-03-09 NOTE — Patient Instructions (Addendum)
Start regualr exercise goal 3 days a week at first, 20 min... Consider water exercise or Yoga. Meet goal 4000 step a day by end of month. Start strengthening/stretching app. Continue to decrease snacking, work on portion control. Try drinking water ahead time.  Follow BP at home , goal < 140/90.  Stop at lab for IFOB.

## 2016-03-09 NOTE — Progress Notes (Signed)
Pre visit review using our clinic review tool, if applicable. No additional management support is needed unless otherwise documented below in the visit note. 

## 2016-03-09 NOTE — Addendum Note (Signed)
Addended by: Carter Kitten on: 03/09/2016 10:00 AM   Modules accepted: Orders

## 2016-03-09 NOTE — Assessment & Plan Note (Signed)
CVD 10 year risk 12.5%.Marland Kitchen Recommend statin initiation. She is hesitant. We will work on lifestlye changes

## 2016-03-18 ENCOUNTER — Other Ambulatory Visit: Payer: Self-pay | Admitting: Family Medicine

## 2016-04-18 ENCOUNTER — Other Ambulatory Visit (INDEPENDENT_AMBULATORY_CARE_PROVIDER_SITE_OTHER): Payer: No Typology Code available for payment source

## 2016-04-18 DIAGNOSIS — Z1211 Encounter for screening for malignant neoplasm of colon: Secondary | ICD-10-CM | POA: Diagnosis not present

## 2016-04-18 LAB — FECAL OCCULT BLOOD, IMMUNOCHEMICAL: Fecal Occult Bld: NEGATIVE

## 2016-04-19 ENCOUNTER — Encounter: Payer: Self-pay | Admitting: *Deleted

## 2016-04-20 ENCOUNTER — Ambulatory Visit: Payer: No Typology Code available for payment source | Admitting: Family Medicine

## 2016-05-11 ENCOUNTER — Ambulatory Visit: Payer: No Typology Code available for payment source | Admitting: Family Medicine

## 2016-09-19 ENCOUNTER — Other Ambulatory Visit: Payer: Self-pay | Admitting: Family Medicine

## 2016-10-23 ENCOUNTER — Other Ambulatory Visit: Payer: Self-pay | Admitting: Family Medicine

## 2016-10-23 DIAGNOSIS — Z1231 Encounter for screening mammogram for malignant neoplasm of breast: Secondary | ICD-10-CM

## 2016-11-07 ENCOUNTER — Ambulatory Visit (INDEPENDENT_AMBULATORY_CARE_PROVIDER_SITE_OTHER): Payer: No Typology Code available for payment source

## 2016-11-07 ENCOUNTER — Ambulatory Visit: Payer: No Typology Code available for payment source

## 2016-11-07 DIAGNOSIS — Z23 Encounter for immunization: Secondary | ICD-10-CM | POA: Diagnosis not present

## 2016-12-04 ENCOUNTER — Ambulatory Visit
Admission: RE | Admit: 2016-12-04 | Discharge: 2016-12-04 | Disposition: A | Discharge status: Home / Self Care | Payer: No Typology Code available for payment source | Source: Ambulatory Visit | Attending: Family Medicine | Admitting: Family Medicine

## 2016-12-04 DIAGNOSIS — Z1231 Encounter for screening mammogram for malignant neoplasm of breast: Secondary | ICD-10-CM

## 2017-03-13 ENCOUNTER — Other Ambulatory Visit: Payer: Self-pay | Admitting: Family Medicine

## 2017-05-07 ENCOUNTER — Ambulatory Visit (INDEPENDENT_AMBULATORY_CARE_PROVIDER_SITE_OTHER): Payer: Medicare HMO | Admitting: Family Medicine

## 2017-05-07 ENCOUNTER — Other Ambulatory Visit: Payer: Self-pay

## 2017-05-07 VITALS — BP 162/62 | HR 92 | Temp 98.5°F | Ht 67.5 in | Wt 353.0 lb

## 2017-05-07 DIAGNOSIS — M79672 Pain in left foot: Secondary | ICD-10-CM | POA: Insufficient documentation

## 2017-05-07 DIAGNOSIS — G8929 Other chronic pain: Secondary | ICD-10-CM

## 2017-05-07 DIAGNOSIS — I10 Essential (primary) hypertension: Secondary | ICD-10-CM

## 2017-05-07 DIAGNOSIS — M25551 Pain in right hip: Secondary | ICD-10-CM

## 2017-05-07 MED ORDER — MELOXICAM 15 MG PO TABS: 15.0000 mg | ORAL_TABLET | Freq: Every day | ORAL | 0 refills | Status: DC

## 2017-05-07 NOTE — Progress Notes (Signed)
Subjective:    Patient ID: Cassie Nelson, female    DOB: 11-28-1950, 67 y.o.   MRN: [id number]  HPI    67 year old  Morbidly obese female with new onset left foot pain in foot and right hip.  She reports  Several months of pain in left lateral foot.. Started after driving a lot in inserts in shoes. No fall. Last week started having pain across top of foot.  Pain with flexing foot up.  mild swelling in dorsal foot,  Pain 4-5 on pain scale. No redness, no heat.  She has also been having right hip pain x several years.. worse in last 6 months.  Pain in right buttock and lateral, no pain in anterior hip. No radiation into leg.  No pain at night, can lie on that side.  Started after steering wheel in car was in wrong place.. made her move wrong repetitively.  Pain greatest with going up and down stairs.  Plans on starting silver sneakers.  No new numbness, or weakness in leg.  She has tried tylenol helped minimally,  Indocin  2-3 times a day x 1-2 weeks ... Helped both issues some.   BP is up today.. At home 150/60s  Social History /Family History/Past Medical History reviewed in detail and updated in EMR if needed. Blood pressure (!) 162/62, pulse 92, temperature 98.5 F (36.9 C), temperature source Oral, height 5' 7.5" (1.715 m), weight (!) 353 lb (160.1 kg).  Review of Systems  Constitutional: Negative for fatigue and fever.  HENT: Negative for congestion.   Eyes: Negative for pain.  Respiratory: Negative for cough and shortness of breath.   Cardiovascular: Negative for chest pain, palpitations and leg swelling.  Gastrointestinal: Negative for abdominal pain.  Genitourinary: Negative for dysuria and vaginal bleeding.  Musculoskeletal: Positive for arthralgias and gait problem. Negative for back pain.  Neurological: Negative for syncope, light-headedness and headaches.  Psychiatric/Behavioral: Negative for dysphoric mood.       Objective:   Physical Exam  Constitutional:  Vital signs are normal. She appears well-developed and well-nourished. She is cooperative.  Non-toxic appearance. She does not appear ill. No distress.  HENT:  Head: Normocephalic.  Right Ear: Hearing, tympanic membrane, external ear and ear canal normal. Tympanic membrane is not erythematous, not retracted and not bulging.  Left Ear: Hearing, tympanic membrane, external ear and ear canal normal. Tympanic membrane is not erythematous, not retracted and not bulging.  Nose: No mucosal edema or rhinorrhea. Right sinus exhibits no maxillary sinus tenderness and no frontal sinus tenderness. Left sinus exhibits no maxillary sinus tenderness and no frontal sinus tenderness.  Mouth/Throat: Uvula is midline, oropharynx is clear and moist and mucous membranes are normal.  Eyes: Pupils are equal, round, and reactive to light. Conjunctivae, EOM and lids are normal. Lids are everted and swept, no foreign bodies found.  Neck: Trachea normal and normal range of motion. Neck supple. Carotid bruit is not present. No thyroid mass and no thyromegaly present.  Cardiovascular: Normal rate, regular rhythm, S1 normal, S2 normal, normal heart sounds, intact distal pulses and normal pulses. Exam reveals no gallop and no friction rub.  No murmur heard. Pulmonary/Chest: Effort normal and breath sounds normal. No tachypnea. No respiratory distress. She has no decreased breath sounds. She has no wheezes. She has no rhonchi. She has no rales.  Abdominal: Soft. Normal appearance and bowel sounds are normal. There is no tenderness.  Musculoskeletal:       Right hip: She  exhibits normal range of motion and normal strength.       Lumbar back: She exhibits decreased range of motion and tenderness. She exhibits no bony tenderness.       Left foot: Normal. There is normal range of motion, no tenderness and no bony tenderness.  ttp in right buttock, noeg faber's neg SLR  Neurological: She is alert.  Skin: Skin is warm, dry and intact.  No rash noted.  Psychiatric: Her speech is normal and behavior is normal. Judgment and thought content normal. Her mood appears not anxious. Cognition and memory are normal. She does not exhibit a depressed mood.          Assessment & Plan:

## 2017-05-07 NOTE — Assessment & Plan Note (Signed)
No indication for X-ray. NO current pain, residual  Swelling only.  Elevate foot above heart as much as able.

## 2017-05-07 NOTE — Assessment & Plan Note (Addendum)
On lisinopril HCTZ and amlodipine. Work on low salt diet. < 1500 mg sodium a day.  Follow blood pressure at home Goal < 140/90.Marland Kitchen bring in measurements to next OV.

## 2017-05-07 NOTE — Patient Instructions (Addendum)
Work on low salt diet. < 1500 mg sodium a day.  Follow blood pressure at home Goal < 140/90.Marland Kitchen bring in measurements to next OV. Please stop at the front desk to set up referral.  Start meloxicam daily.

## 2017-05-10 DIAGNOSIS — M25551 Pain in right hip: Secondary | ICD-10-CM | POA: Diagnosis not present

## 2017-05-10 DIAGNOSIS — G8929 Other chronic pain: Secondary | ICD-10-CM | POA: Diagnosis not present

## 2017-05-17 DIAGNOSIS — M25551 Pain in right hip: Secondary | ICD-10-CM | POA: Diagnosis not present

## 2017-05-17 DIAGNOSIS — G8929 Other chronic pain: Secondary | ICD-10-CM | POA: Diagnosis not present

## 2017-05-24 DIAGNOSIS — M25551 Pain in right hip: Secondary | ICD-10-CM | POA: Diagnosis not present

## 2017-05-24 DIAGNOSIS — G8929 Other chronic pain: Secondary | ICD-10-CM | POA: Diagnosis not present

## 2017-05-27 ENCOUNTER — Other Ambulatory Visit: Payer: Self-pay | Admitting: Family Medicine

## 2017-05-27 NOTE — Telephone Encounter (Signed)
Last office visit 05/07/2017.  Last refilled 05/07/2017 for #30 with no refills.  Ok to refill?

## 2017-05-31 DIAGNOSIS — G8929 Other chronic pain: Secondary | ICD-10-CM | POA: Diagnosis not present

## 2017-05-31 DIAGNOSIS — M25551 Pain in right hip: Secondary | ICD-10-CM | POA: Diagnosis not present

## 2017-06-06 ENCOUNTER — Other Ambulatory Visit: Payer: Self-pay | Admitting: Family Medicine

## 2017-06-07 ENCOUNTER — Other Ambulatory Visit: Payer: Medicare HMO

## 2017-06-07 DIAGNOSIS — M25551 Pain in right hip: Secondary | ICD-10-CM | POA: Diagnosis not present

## 2017-06-07 DIAGNOSIS — G8929 Other chronic pain: Secondary | ICD-10-CM | POA: Diagnosis not present

## 2017-06-13 ENCOUNTER — Encounter: Payer: Medicare HMO | Admitting: Family Medicine

## 2017-06-14 DIAGNOSIS — M25551 Pain in right hip: Secondary | ICD-10-CM | POA: Diagnosis not present

## 2017-06-14 DIAGNOSIS — G8929 Other chronic pain: Secondary | ICD-10-CM | POA: Diagnosis not present

## 2017-06-21 DIAGNOSIS — M25551 Pain in right hip: Secondary | ICD-10-CM | POA: Diagnosis not present

## 2017-06-21 DIAGNOSIS — G8929 Other chronic pain: Secondary | ICD-10-CM | POA: Diagnosis not present

## 2017-06-27 ENCOUNTER — Other Ambulatory Visit: Payer: Self-pay | Admitting: *Deleted

## 2017-06-27 ENCOUNTER — Encounter: Payer: Self-pay | Admitting: Family Medicine

## 2017-06-27 DIAGNOSIS — G8929 Other chronic pain: Secondary | ICD-10-CM | POA: Insufficient documentation

## 2017-06-27 DIAGNOSIS — M25551 Pain in right hip: Secondary | ICD-10-CM

## 2017-06-27 MED ORDER — MELOXICAM 15 MG PO TABS: 15.0000 mg | ORAL_TABLET | Freq: Every day | ORAL | 0 refills | Status: DC

## 2017-06-27 NOTE — Assessment & Plan Note (Addendum)
Start meloxicam for inflammation and pain. Heat, massage and refer to PT.

## 2017-06-27 NOTE — Telephone Encounter (Signed)
Last office visit 05/07/2017.  Last refilled 05/28/2017 for #30 with no refills.  Ok to refill?

## 2017-06-28 ENCOUNTER — Telehealth: Payer: Self-pay | Admitting: Family Medicine

## 2017-06-28 DIAGNOSIS — R7303 Prediabetes: Secondary | ICD-10-CM

## 2017-06-28 DIAGNOSIS — E7800 Pure hypercholesterolemia, unspecified: Secondary | ICD-10-CM

## 2017-06-28 NOTE — Telephone Encounter (Signed)
-----   Message from [name] sent at 06/25/2017 10:16 AM EDT ----- Regarding: Lab orders for Monday, 6.17.19 Patient is scheduled for CPX labs, please order future labs, Thanks , Karna Christmas

## 2017-07-01 ENCOUNTER — Other Ambulatory Visit (INDEPENDENT_AMBULATORY_CARE_PROVIDER_SITE_OTHER): Payer: Medicare HMO

## 2017-07-01 DIAGNOSIS — R7303 Prediabetes: Secondary | ICD-10-CM | POA: Diagnosis not present

## 2017-07-01 DIAGNOSIS — E7800 Pure hypercholesterolemia, unspecified: Secondary | ICD-10-CM | POA: Diagnosis not present

## 2017-07-01 LAB — LIPID PANEL
Cholesterol: 190 mg/dL (ref 0–200)
HDL: 40.2 mg/dL (ref >39.00)
NonHDL: 149.98
Total CHOL/HDL Ratio: 5
Triglycerides: 225 mg/dL — ABNORMAL HIGH (ref 0.0–149.0)
VLDL: 45 mg/dL — ABNORMAL HIGH (ref 0.0–40.0)

## 2017-07-01 LAB — COMPREHENSIVE METABOLIC PANEL
ALT: 19 U/L (ref 0–35)
AST: 15 U/L (ref 0–37)
Albumin: 4.1 g/dL (ref 3.5–5.2)
Alkaline Phosphatase: 74 U/L (ref 39–117)
BUN: 17 mg/dL (ref 6–23)
CO2: 24 mEq/L (ref 19–32)
Calcium: 9.8 mg/dL (ref 8.4–10.5)
Chloride: 101 mEq/L (ref 96–112)
Creatinine, Ser: 0.98 mg/dL (ref 0.40–1.20)
GFR: 60.2 mL/min (ref >60.00)
Glucose, Bld: 122 mg/dL — ABNORMAL HIGH (ref 70–99)
Potassium: 4.1 mEq/L (ref 3.5–5.1)
Sodium: 136 mEq/L (ref 135–145)
Total Bilirubin: 0.4 mg/dL (ref 0.2–1.2)
Total Protein: 7.5 g/dL (ref 6.0–8.3)

## 2017-07-01 LAB — LDL CHOLESTEROL, DIRECT: Direct LDL: 125 mg/dL

## 2017-07-01 LAB — HEMOGLOBIN A1C: Hgb A1c MFr Bld: 6.2 % (ref 4.6–6.5)

## 2017-07-05 ENCOUNTER — Encounter: Payer: Medicare HMO | Admitting: Family Medicine

## 2017-07-12 ENCOUNTER — Other Ambulatory Visit: Payer: Self-pay

## 2017-07-12 ENCOUNTER — Encounter: Payer: Self-pay | Admitting: Family Medicine

## 2017-07-12 ENCOUNTER — Ambulatory Visit (INDEPENDENT_AMBULATORY_CARE_PROVIDER_SITE_OTHER): Payer: Medicare HMO | Admitting: Family Medicine

## 2017-07-12 VITALS — BP 152/78 | HR 84 | Temp 98.5°F | Ht 67.5 in | Wt 354.0 lb

## 2017-07-12 DIAGNOSIS — M25551 Pain in right hip: Secondary | ICD-10-CM

## 2017-07-12 DIAGNOSIS — Z0000 Encounter for general adult medical examination without abnormal findings: Secondary | ICD-10-CM

## 2017-07-12 DIAGNOSIS — E7800 Pure hypercholesterolemia, unspecified: Secondary | ICD-10-CM

## 2017-07-12 DIAGNOSIS — R7303 Prediabetes: Secondary | ICD-10-CM

## 2017-07-12 DIAGNOSIS — G8929 Other chronic pain: Secondary | ICD-10-CM | POA: Diagnosis not present

## 2017-07-12 DIAGNOSIS — Z6841 Body Mass Index (BMI) 40.0 and over, adult: Secondary | ICD-10-CM

## 2017-07-12 DIAGNOSIS — I10 Essential (primary) hypertension: Secondary | ICD-10-CM | POA: Diagnosis not present

## 2017-07-12 DIAGNOSIS — Z23 Encounter for immunization: Secondary | ICD-10-CM

## 2017-07-12 NOTE — Patient Instructions (Addendum)
Working on increase activity with rehab.. Weight loss.  Low carbohydrate, low sugar diet. Decrease portion size.  Start red yeast rice 600 mg 2 caps twice daily.

## 2017-07-12 NOTE — Assessment & Plan Note (Signed)
She plans to focus on rehab and PT for now. Will let me know if further rehab needed.

## 2017-07-12 NOTE — Assessment & Plan Note (Signed)
Working on low carb diet.. Will decrease further , increase  Exercsie. Follow over time.

## 2017-07-12 NOTE — Assessment & Plan Note (Signed)
High in office.Marland Kitchen Possible white coat. Good control on regimen at home.

## 2017-07-12 NOTE — Progress Notes (Signed)
Subjective:    Patient ID: Cassie Nelson, female    DOB: 1950/10/10, 67 y.o.   MRN: [id number]  HPI  The patient presents for annual medicare wellness, complete physical and review of chronic health problems.  I have personally reviewed the Medicare Annual Wellness questionnaire and have noted 1. The patient's medical and social history 2. Their use of alcohol, tobacco or illicit drugs 3. Their current medications and supplements 4. The patient's functional ability including ADL's, fall risks, home safety risks and hearing or visual             impairment. 5. Diet and physical activities 6. Evidence for depression or mood disorders 7.         Updated provider list Cognitive evaluation was performed and recorded on pt medicare questionnaire form. The patients weight, height, BMI and visual acuity have been recorded in the chart  I have made referrals, counseling and provided education to the patient based review of the above and I have provided the pt with a written personalized care plan for preventive services.   Documentation of this information was scanned into the electronic record under the media tab.  Hypertension:   BP elevated today. On lisinopril HCTZ, amlodipine Using medication without problems or lightheadedness:  none Chest pain with exertion:none Edema:none Short of breath:one Average home BPs: 129-135/59-62 Other issues:   prediabetes Chronic hip pain: Meloxicam did not help.  PT helping some. She is going to [facility] to rehab with weights   Elevated Cholesterol:  Statin indicated.. notat goal. Lab Results  Component Value Date   CHOL 190 07/01/2017   HDL 40.20 07/01/2017   LDLCALC 110 (H) 03/02/2016   LDLDIRECT 125.0 07/01/2017   TRIG 225.0 (H) 07/01/2017   CHOLHDL 5 07/01/2017  Using medications without problems: Muscle aches:  Diet compliance: moderate Exercise: working on rehab Other complaints: morbid obesity.    Advance directives and end of life  planning reviewed in detail with patient and documented in EMR. Patient given handout on advance care directives if needed. HCPOA and living will updated if needed.   Hearing Screening   Method: Audiometry   125Hz  250Hz  500Hz  1000Hz  2000Hz  3000Hz  4000Hz  6000Hz  8000Hz   Right ear:   20 20 20  20     Left ear:   20 20 20  20     Vision Screening Comments: Wears Glasses-Eye Exam with Dr. Anastasia Pall 12/2016  Fall Risk  03/09/2016  Falls in the past year? No   Depression screen [facility] 2/9 07/12/2017 03/09/2016  Decreased Interest 0 0  Down, Depressed, Hopeless 0 0  PHQ - 2 Score 0 0   Social History /Family History/Past Medical History reviewed in detail and updated in EMR if needed. Blood pressure (!) 152/78, pulse 84, temperature 98.5 F (36.9 C), temperature source Oral, height 5' 7.5" (1.715 m), weight (!) 354 lb (160.6 kg).  Review of Systems  Constitutional: Negative for fatigue and fever.  HENT: Negative for congestion.   Eyes: Negative for pain.  Respiratory: Negative for cough and shortness of breath.   Cardiovascular: Negative for chest pain, palpitations and leg swelling.  Gastrointestinal: Negative for abdominal pain.  Genitourinary: Negative for dysuria and vaginal bleeding.  Musculoskeletal: Negative for back pain.  Neurological: Negative for syncope, light-headedness and headaches.  Psychiatric/Behavioral: Negative for dysphoric mood.       Objective:   Physical Exam  Constitutional: Vital signs are normal. She appears well-developed and well-nourished. She is cooperative.  Non-toxic appearance. She does not  appear ill. No distress.  HENT:  Head: Normocephalic.  Right Ear: Hearing, tympanic membrane, external ear and ear canal normal.  Left Ear: Hearing, tympanic membrane, external ear and ear canal normal.  Nose: Nose normal.  Eyes: Pupils are equal, round, and reactive to light. Conjunctivae, EOM and lids are normal. Lids are everted and swept, no foreign bodies found.   Neck: Trachea normal and normal range of motion. Neck supple. Carotid bruit is not present. No thyroid mass and no thyromegaly present.  Cardiovascular: Normal rate, regular rhythm, S1 normal, S2 normal, normal heart sounds and intact distal pulses. Exam reveals no gallop.  No murmur heard. Pulmonary/Chest: Effort normal and breath sounds normal. No respiratory distress. She has no wheezes. She has no rhonchi. She has no rales.  Abdominal: Soft. Normal appearance and bowel sounds are normal. She exhibits no distension, no fluid wave, no abdominal bruit and no mass. There is no hepatosplenomegaly. There is no tenderness. There is no rebound, no guarding and no CVA tenderness. No hernia.  Lymphadenopathy:    She has no cervical adenopathy.    She has no axillary adenopathy.  Neurological: She is alert. She has normal strength. No cranial nerve deficit or sensory deficit.  Skin: Skin is warm, dry and intact. No rash noted.  Psychiatric: Her speech is normal and behavior is normal. Judgment normal. Her mood appears not anxious. Cognition and memory are normal. She does not exhibit a depressed mood.          Assessment & Plan:  The patient's preventative maintenance and recommended screening tests for an annual wellness exam were reviewed in full today. Brought up to date unless services declined.  Counselled on the importance of diet, exercise, and its role in overall health and mortality. The patient's FH and SH was reviewed, including their home life, tobacco status, and drug and alcohol status.   Vaccines: Uptodate with Tdap, shingles vaccine, flu,  Due for pneumovax Mammo: nml in 11/2016 pap/dve : Pap nml 2014,2015,2016 She has never had an abnormal but last one prior was years ago about 2005. Plan stopping after age 18. No DVE given no family history of ovarian uterine cancer Colon: No family history of colon cancer known. Plan cologuard DEXA: 10/2013 normal.. Repeat in 5 years.   v

## 2017-07-12 NOTE — Assessment & Plan Note (Signed)
Counseled on health issues  Associated with morbid obesity. Encouraged exercise, weight loss, healthy eating habits.

## 2017-07-12 NOTE — Assessment & Plan Note (Signed)
High risk for CVD per risk calculator.  trial of red yeast rice.   re-eval in 3 months.

## 2017-07-29 DIAGNOSIS — Z1212 Encounter for screening for malignant neoplasm of rectum: Secondary | ICD-10-CM | POA: Diagnosis not present

## 2017-07-29 DIAGNOSIS — Z1211 Encounter for screening for malignant neoplasm of colon: Secondary | ICD-10-CM | POA: Diagnosis not present

## 2017-07-29 LAB — COLOGUARD: COLOGUARD: NEGATIVE

## 2017-08-14 ENCOUNTER — Encounter: Payer: Self-pay | Admitting: Family Medicine

## 2017-08-31 ENCOUNTER — Other Ambulatory Visit: Payer: Self-pay | Admitting: Family Medicine

## 2017-10-05 ENCOUNTER — Telehealth: Payer: Self-pay | Admitting: Family Medicine

## 2017-10-05 DIAGNOSIS — E7800 Pure hypercholesterolemia, unspecified: Secondary | ICD-10-CM

## 2017-10-05 DIAGNOSIS — R7303 Prediabetes: Secondary | ICD-10-CM

## 2017-10-05 NOTE — Telephone Encounter (Signed)
-----   Message from Lendon Collar, RT sent at 09/30/2017  9:46 AM EDT ----- Regarding: Lab orders for Friday 10/11/17 Please enter 54month follow up lab orders for 10/11/17. Thanks!

## 2017-10-09 ENCOUNTER — Ambulatory Visit (INDEPENDENT_AMBULATORY_CARE_PROVIDER_SITE_OTHER): Payer: Medicare HMO | Admitting: Family Medicine

## 2017-10-09 ENCOUNTER — Ambulatory Visit (INDEPENDENT_AMBULATORY_CARE_PROVIDER_SITE_OTHER)
Admission: RE | Admit: 2017-10-09 | Discharge: 2017-10-09 | Disposition: A | Discharge status: Home / Self Care | Payer: Medicare HMO | Source: Ambulatory Visit | Attending: Family Medicine | Admitting: Family Medicine

## 2017-10-09 ENCOUNTER — Encounter: Payer: Self-pay | Admitting: Family Medicine

## 2017-10-09 VITALS — BP 140/60 | HR 80 | Temp 98.5°F | Ht 67.5 in | Wt 358.5 lb

## 2017-10-09 DIAGNOSIS — G8929 Other chronic pain: Secondary | ICD-10-CM

## 2017-10-09 DIAGNOSIS — M1611 Unilateral primary osteoarthritis, right hip: Secondary | ICD-10-CM

## 2017-10-09 DIAGNOSIS — Z6841 Body Mass Index (BMI) 40.0 and over, adult: Secondary | ICD-10-CM | POA: Diagnosis not present

## 2017-10-09 DIAGNOSIS — M25551 Pain in right hip: Secondary | ICD-10-CM

## 2017-10-09 DIAGNOSIS — Z23 Encounter for immunization: Secondary | ICD-10-CM | POA: Diagnosis not present

## 2017-10-09 NOTE — Progress Notes (Signed)
Dr. Frederico Hamman T. Coraleigh Sheeran, MD, [facility] [place], [address] Phone: [phone] Fax: [phone]  10/09/2017  Patient: Cassie Nelson, MRN: [id number], DOB: 07/29/1950, 67 y.o.  Primary Physician:  Jinny Sanders, MD   Chief Complaint  Patient presents with  . Hip Pain    Right- Had Indocin 50 mg and took which helped   Subjective:   Cassie Nelson is a 67 y.o. very pleasant female patient who presents with the following:  Ongoing now for a long time, back in April has been taking some NSAIDS and has been getting worse. Abduction seems to hurt  A lot. Saw Dr. Jacinto Reap in April. Did some PT and has had a very busy summer. Body mass index is 55.32 kg/m.   Lateral hip, pain going up and down the stairs. Some pain in the R buttocks and denies groin pain. Pain going up stairs and in and out of a car.   R hip OA  Past Medical History, Surgical History, Social History, Family History, Problem List, Medications, and Allergies have been reviewed and updated if relevant.  Patient Active Problem List   Diagnosis Date Noted  . Chronic right hip pain 06/27/2017  . Left foot pain 05/07/2017  . Morbid obesity with body mass index (BMI) of 50.0 to 59.9 in adult [facility]) 01/06/2015  . Metabolic syndrome 31/51/7616  . Diverticulosis 10/20/2013  . High cholesterol 06/20/2012  . Prediabetes 06/20/2012  . Stress incontinence 05/22/2012  . HTN (hypertension) 05/14/2012    Past Medical History:  Diagnosis Date  . Diverticular disease   . Hx of diverticulitis of colon 2009, 2010  . Hypertension   . Shortness of breath    on exertion    Past Surgical History:  Procedure Laterality Date  . DILITATION & CURRETTAGE/HYSTROSCOPY WITH NOVASURE ABLATION N/A 07/03/2012   Procedure: DILATATION & CURETTAGE/HYSTEROSCOPY WITH NOVASURE ABLATION;  Surgeon: Lavonia Drafts, MD;  Location: [facility];  Service: Gynecology;  Laterality: N/A;  .  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, surgery at 15 months old  . MOUTH SURGERY     biopsy of cheek  . TONSILLECTOMY      Social History   Socioeconomic History  . Marital status: Single    Spouse name: Not on file  . Number of children: 0  . Years of education: Not on file  . Highest education level: Not on file  Occupational History  . Occupation: Tourist information centre manager: [employer]  Social Needs  . Financial resource strain: Not on file  . Food insecurity:    Worry: Not on file    Inability: Not on file  . Transportation needs:    Medical: Not on file    Non-medical: Not on file  Tobacco Use  . Smoking status: Former Smoker    Years: 25.00    Last attempt to quit: 06/22/2010    Years since quitting: 7.3  . Smokeless tobacco: Former Systems developer    Quit date: 09/23/2010  Substance and Sexual Activity  . Alcohol use: Yes    Alcohol/week: 1.0 standard drinks    Types: 1 drink(s) per week    Comment: occasionally  . Drug use: No  . Sexual activity: Never  Lifestyle  . Physical activity:    Days per week: Not on file    Minutes per session: Not on file  . Stress: Not on file  Relationships  .  Social connections:    Talks on phone: Not on file    Gets together: Not on file    Attends religious service: Not on file    Active member of club or organization: Not on file    Attends meetings of clubs or organizations: Not on file    Relationship status: Not on file  . Intimate partner violence:    Fear of current or ex partner: Not on file    Emotionally abused: Not on file    Physically abused: Not on file    Forced sexual activity: Not on file  Other Topics Concern  . Not on file  Social History Narrative   Single, No kids.   Works as a Orthoptist for [employer].    Family History  Problem Relation Age of Onset  . Heart disease Father   . Cancer Father 76       colon cancer  . Arthritis Father   . Hypertension Father   . Hypothyroidism Father   .  Hypertension Mother   . Hypothyroidism Mother     Allergies  Allergen Reactions  . Levofloxacin Other (See Comments)    Joint Pain    Medication list reviewed and updated in full in [place].  GEN: No fevers, chills. Nontoxic. Primarily MSK c/o today. MSK: Detailed in the HPI GI: tolerating PO intake without difficulty Neuro: No numbness, parasthesias, or tingling associated. Otherwise the pertinent positives of the ROS are noted above.   Objective:   BP 140/60   Pulse 80   Temp 98.5 F (36.9 C) (Oral)   Ht 5' 7.5" (1.715 m)   Wt (!) 358 lb 8 oz (162.6 kg)   BMI 55.32 kg/m    GEN: WDWN, NAD, Non-toxic, Alert & Oriented x 3 HEENT: Atraumatic, Normocephalic.  Ears and Nose: No external deformity. EXTR: No clubbing/cyanosis/edema NEURO: antalgic gait PSYCH: Normally interactive. Conversant. Not depressed or anxious appearing.  Calm demeanor.   HIP EXAM: SIDE: R ROM: Abduction, Flexion, Internal and External range of motion: minimal abduction, approx 5 deg. Maximum 5-6 deg of rotational movement. Pain with terminal IROM and EROM: y GTB: NT SLR: NEG Knees: No effusion FABER: NT REVERSE FABER: NT, neg Piriformis: NT at direct palpation Str: flexion: 5/5 abduction: 5/5 adduction: 5/5 Strength testing non-tender  Radiology: Dg Hip Unilat With Pelvis 2-3 Views Right  Result Date: 10/09/2017 CLINICAL DATA:  Right hip pain without injury. EXAM: DG HIP (WITH OR WITHOUT PELVIS) 2-3V RIGHT COMPARISON:  None. FINDINGS: Advanced right hip osteoarthritis with superolateral joint narrowing, sclerosis, and spurring. No fracture or erosive change. IMPRESSION: Advanced right hip osteoarthritis. Electronically Signed   By: Monte Fantasia M.D.   On: 10/09/2017 16:31     Assessment and Plan:   Primary localized osteoarthrosis of right hip  Need for prophylactic vaccination and inoculation against influenza - Plan: Flu Vaccine QUAD 6+ mos PF IM (Fluarix Quad  PF)  Chronic right hip pain - Plan: DG HIP UNILAT WITH PELVIS 2-3 VIEWS RIGHT  Morbid obesity with BMI of 50.0-59.9, adult (HCC)   >25 minutes spent in face to face time with patient, >50% spent in counselling or coordination of care   End stage R hip OA on plain films with markedly abnormal hip exam. The patient's problem is end stage hip OA.  Challenging situation with BMI 56. She needs to lose weight at all costs. She is not THA candidate until she loses a significant amount of weight.  I have made a number of different suggestions to her, but the primary issue is weight loss.   Any time she desires, I can set up a hip injection under fluoro through radiology. I do not believe I could do an intraarticular injection in this case with [place].  Patient Instructions  Take Tylenol/Acetaminophen ES (500mg ) 2 tabs by mouth three times a day max as needed.   Alleve 2 tabs by mouth two times a day over the counter: Take at least for 2 - 3 weeks. This is equal to a prescripton strength dose (GENERIC CHEAPER EQUIVALENT IS NAPROXEN SODIUM)   Tart Cherry Juice Concentrate: 1,000 mg at night   Call me in 6-8 weeks if your hip is not doing well, and we can set up a hip injection through radiology.    Follow-up: prn  Orders Placed This Encounter  Procedures  . DG HIP UNILAT WITH PELVIS 2-3 VIEWS RIGHT  . Flu Vaccine QUAD 6+ mos PF IM (Fluarix Quad PF)    Signed,  Arrow Tomko T. Jerusha Reising, MD   Allergies as of 10/09/2017      Reactions   Levofloxacin Other (See Comments)   Joint Pain      Medication List        Accurate as of 10/09/17 11:59 PM. Always use your most recent med list.          amLODipine 10 MG tablet Commonly known as:  NORVASC TAKE 1 TABLET BY MOUTH EVERY DAY   CALCIUM-VITAMIN D PO Take 1 tablet by mouth daily.   fish oil-omega-3 fatty acids 1000 MG capsule Take 2 g by mouth daily.   lisinopril-hydrochlorothiazide 20-12.5 MG tablet Commonly known as:   PRINZIDE,ZESTORETIC TAKE 2 TABLETS BY MOUTH EVERY DAY   multivitamin tablet Take 1 tablet by mouth daily.   Red Yeast Rice 600 MG Caps Take 2 capsules by mouth 2 (two) times daily.

## 2017-10-09 NOTE — Patient Instructions (Signed)
Take Tylenol/Acetaminophen ES (500mg ) 2 tabs by mouth three times a day max as needed.   Alleve 2 tabs by mouth two times a day over the counter: Take at least for 2 - 3 weeks. This is equal to a prescripton strength dose (GENERIC CHEAPER EQUIVALENT IS NAPROXEN SODIUM)   Tart Cherry Juice Concentrate: 1,000 mg at night   Call me in 6-8 weeks if your hip is not doing well, and we can set up a hip injection through radiology.

## 2017-10-11 ENCOUNTER — Other Ambulatory Visit (INDEPENDENT_AMBULATORY_CARE_PROVIDER_SITE_OTHER): Payer: Medicare HMO

## 2017-10-11 DIAGNOSIS — E7800 Pure hypercholesterolemia, unspecified: Secondary | ICD-10-CM

## 2017-10-11 DIAGNOSIS — R7303 Prediabetes: Secondary | ICD-10-CM

## 2017-10-11 LAB — LIPID PANEL
CHOLESTEROL: 169 mg/dL (ref 0–200)
HDL: 39.5 mg/dL (ref >39.00)
LDL Cholesterol: 96 mg/dL (ref 0–99)
NONHDL: 129.84
Total CHOL/HDL Ratio: 4
Triglycerides: 169 mg/dL — ABNORMAL HIGH (ref 0.0–149.0)
VLDL: 33.8 mg/dL (ref 0.0–40.0)

## 2017-10-11 LAB — COMPREHENSIVE METABOLIC PANEL
ALBUMIN: 4.1 g/dL (ref 3.5–5.2)
ALK PHOS: 70 U/L (ref 39–117)
ALT: 18 U/L (ref 0–35)
AST: 15 U/L (ref 0–37)
BILIRUBIN TOTAL: 0.3 mg/dL (ref 0.2–1.2)
BUN: 23 mg/dL (ref 6–23)
CALCIUM: 9.6 mg/dL (ref 8.4–10.5)
CO2: 24 mEq/L (ref 19–32)
Chloride: 101 mEq/L (ref 96–112)
Creatinine, Ser: 1.02 mg/dL (ref 0.40–1.20)
GFR: 57.44 mL/min — AB (ref >60.00)
GLUCOSE: 120 mg/dL — AB (ref 70–99)
Potassium: 4.2 mEq/L (ref 3.5–5.1)
Sodium: 136 mEq/L (ref 135–145)
TOTAL PROTEIN: 7.8 g/dL (ref 6.0–8.3)

## 2017-10-11 LAB — HEMOGLOBIN A1C: Hgb A1c MFr Bld: 6.3 % (ref 4.6–6.5)

## 2017-10-15 ENCOUNTER — Ambulatory Visit: Payer: Medicare HMO | Admitting: Family Medicine

## 2017-11-24 ENCOUNTER — Other Ambulatory Visit: Payer: Self-pay | Admitting: Family Medicine

## 2017-11-25 DIAGNOSIS — H579 Unspecified disorder of eye and adnexa: Secondary | ICD-10-CM | POA: Diagnosis not present

## 2017-12-05 DIAGNOSIS — S0502XA Injury of conjunctiva and corneal abrasion without foreign body, left eye, initial encounter: Secondary | ICD-10-CM | POA: Diagnosis not present

## 2017-12-05 DIAGNOSIS — H5712 Ocular pain, left eye: Secondary | ICD-10-CM | POA: Diagnosis not present

## 2017-12-16 ENCOUNTER — Other Ambulatory Visit: Payer: Self-pay | Admitting: Family Medicine

## 2017-12-16 DIAGNOSIS — Z1231 Encounter for screening mammogram for malignant neoplasm of breast: Secondary | ICD-10-CM

## 2017-12-18 ENCOUNTER — Ambulatory Visit
Admission: RE | Admit: 2017-12-18 | Discharge: 2017-12-18 | Disposition: A | Discharge status: Home / Self Care | Payer: Medicare HMO | Source: Ambulatory Visit | Attending: Family Medicine | Admitting: Family Medicine

## 2017-12-18 DIAGNOSIS — H0288B Meibomian gland dysfunction left eye, upper and lower eyelids: Secondary | ICD-10-CM | POA: Diagnosis not present

## 2017-12-18 DIAGNOSIS — H0288A Meibomian gland dysfunction right eye, upper and lower eyelids: Secondary | ICD-10-CM | POA: Diagnosis not present

## 2017-12-18 DIAGNOSIS — H5213 Myopia, bilateral: Secondary | ICD-10-CM | POA: Diagnosis not present

## 2017-12-18 DIAGNOSIS — H524 Presbyopia: Secondary | ICD-10-CM | POA: Diagnosis not present

## 2017-12-18 DIAGNOSIS — H2513 Age-related nuclear cataract, bilateral: Secondary | ICD-10-CM | POA: Diagnosis not present

## 2017-12-18 DIAGNOSIS — H52223 Regular astigmatism, bilateral: Secondary | ICD-10-CM | POA: Diagnosis not present

## 2017-12-18 DIAGNOSIS — Z1231 Encounter for screening mammogram for malignant neoplasm of breast: Secondary | ICD-10-CM | POA: Diagnosis not present

## 2018-01-31 DIAGNOSIS — S0502XA Injury of conjunctiva and corneal abrasion without foreign body, left eye, initial encounter: Secondary | ICD-10-CM | POA: Diagnosis not present

## 2018-01-31 DIAGNOSIS — X58XXXA Exposure to other specified factors, initial encounter: Secondary | ICD-10-CM | POA: Diagnosis not present

## 2018-01-31 DIAGNOSIS — H18832 Recurrent erosion of cornea, left eye: Secondary | ICD-10-CM | POA: Diagnosis not present

## 2018-02-24 ENCOUNTER — Telehealth: Payer: Self-pay | Admitting: Family Medicine

## 2018-02-24 ENCOUNTER — Other Ambulatory Visit: Payer: Self-pay | Admitting: Family Medicine

## 2018-02-24 DIAGNOSIS — Z6841 Body Mass Index (BMI) 40.0 and over, adult: Secondary | ICD-10-CM

## 2018-02-24 DIAGNOSIS — M1611 Unilateral primary osteoarthritis, right hip: Secondary | ICD-10-CM

## 2018-02-24 NOTE — Telephone Encounter (Signed)
Pt was seen by Dr.Copland on 10/09/17 and was diagnosed with arthritis. She was told to call back in 8 weeks if the pain continued and he would put in a referral for the pt to get a hip inj. Please advise

## 2018-02-24 NOTE — Telephone Encounter (Signed)
I sent in a referral for her to get it done under x-ray guidance at [facility] on [address].  She should get a call about her appointment time within a few days

## 2018-02-25 NOTE — Telephone Encounter (Signed)
Called [name] at [facility], she has the order and said she will call patient today to schedule. Called patient and had to [facility] that [name] would be calling her to schedule.

## 2018-03-04 ENCOUNTER — Ambulatory Visit
Admission: RE | Admit: 2018-03-04 | Discharge: 2018-03-04 | Disposition: A | Discharge status: Home / Self Care | Payer: Medicare HMO | Source: Ambulatory Visit | Attending: Family Medicine | Admitting: Family Medicine

## 2018-03-04 DIAGNOSIS — M1611 Unilateral primary osteoarthritis, right hip: Secondary | ICD-10-CM

## 2018-03-04 DIAGNOSIS — Z6841 Body Mass Index (BMI) 40.0 and over, adult: Secondary | ICD-10-CM

## 2018-03-04 MED ORDER — IOPAMIDOL (ISOVUE-M 200) INJECTION 41%
1.0000 mL | Freq: Once | INTRAMUSCULAR | Status: AC
  Administered 2018-03-04: 1 mL via INTRA_ARTICULAR

## 2018-03-04 MED ORDER — METHYLPREDNISOLONE ACETATE 40 MG/ML INJ SUSP (RADIOLOG
120.0000 mg | Freq: Once | INTRAMUSCULAR | Status: AC
  Administered 2018-03-04: 120 mg via INTRA_ARTICULAR

## 2018-05-23 ENCOUNTER — Telehealth: Payer: Self-pay | Admitting: Family Medicine

## 2018-05-23 ENCOUNTER — Other Ambulatory Visit: Payer: Self-pay | Admitting: Family Medicine

## 2018-05-23 DIAGNOSIS — R7303 Prediabetes: Secondary | ICD-10-CM

## 2018-05-23 DIAGNOSIS — I10 Essential (primary) hypertension: Secondary | ICD-10-CM

## 2018-05-23 NOTE — Telephone Encounter (Signed)
-----   Message from [name] sent at 05/23/2018  1:42 PM EDT ----- Regarding: Lab orders for Tuesday, 5.12.20 Lab orders, thanks

## 2018-05-27 ENCOUNTER — Other Ambulatory Visit (INDEPENDENT_AMBULATORY_CARE_PROVIDER_SITE_OTHER): Payer: Medicare HMO

## 2018-05-27 ENCOUNTER — Other Ambulatory Visit: Payer: Medicare HMO

## 2018-05-27 DIAGNOSIS — I10 Essential (primary) hypertension: Secondary | ICD-10-CM

## 2018-05-27 DIAGNOSIS — R7303 Prediabetes: Secondary | ICD-10-CM

## 2018-05-27 LAB — COMPREHENSIVE METABOLIC PANEL
ALT: 23 U/L (ref 0–35)
AST: 21 U/L (ref 0–37)
Albumin: 4.3 g/dL (ref 3.5–5.2)
Alkaline Phosphatase: 100 U/L (ref 39–117)
BUN: 22 mg/dL (ref 6–23)
CO2: 26 mEq/L (ref 19–32)
Calcium: 9.9 mg/dL (ref 8.4–10.5)
Chloride: 100 mEq/L (ref 96–112)
Creatinine, Ser: 1.05 mg/dL (ref 0.40–1.20)
GFR: 52.16 mL/min — ABNORMAL LOW (ref >60.00)
Glucose, Bld: 95 mg/dL (ref 70–99)
Potassium: 4.4 mEq/L (ref 3.5–5.1)
Sodium: 137 mEq/L (ref 135–145)
Total Bilirubin: 0.4 mg/dL (ref 0.2–1.2)
Total Protein: 7.8 g/dL (ref 6.0–8.3)

## 2018-05-27 LAB — LIPID PANEL
Cholesterol: 179 mg/dL (ref 0–200)
HDL: 36.6 mg/dL — ABNORMAL LOW (ref >39.00)
LDL Cholesterol: 111 mg/dL — ABNORMAL HIGH (ref 0–99)
NonHDL: 141.92
Total CHOL/HDL Ratio: 5
Triglycerides: 157 mg/dL — ABNORMAL HIGH (ref 0.0–149.0)
VLDL: 31.4 mg/dL (ref 0.0–40.0)

## 2018-05-27 LAB — HEMOGLOBIN A1C: Hgb A1c MFr Bld: 5.7 % (ref 4.6–6.5)

## 2018-05-29 ENCOUNTER — Encounter: Payer: Self-pay | Admitting: Family Medicine

## 2018-05-29 ENCOUNTER — Ambulatory Visit (INDEPENDENT_AMBULATORY_CARE_PROVIDER_SITE_OTHER): Payer: Medicare HMO | Admitting: Family Medicine

## 2018-05-29 VITALS — BP 135/59 | HR 63 | Ht 67.5 in | Wt 306.0 lb

## 2018-05-29 DIAGNOSIS — R7303 Prediabetes: Secondary | ICD-10-CM

## 2018-05-29 DIAGNOSIS — I10 Essential (primary) hypertension: Secondary | ICD-10-CM | POA: Diagnosis not present

## 2018-05-29 DIAGNOSIS — E7800 Pure hypercholesterolemia, unspecified: Secondary | ICD-10-CM | POA: Diagnosis not present

## 2018-05-29 DIAGNOSIS — Z6841 Body Mass Index (BMI) 40.0 and over, adult: Secondary | ICD-10-CM

## 2018-05-29 DIAGNOSIS — Z7189 Other specified counseling: Secondary | ICD-10-CM

## 2018-05-29 MED ORDER — MELOXICAM 15 MG PO TABS: 15.0000 mg | ORAL_TABLET | Freq: Every day | ORAL | 2 refills | Status: DC

## 2018-05-29 NOTE — Assessment & Plan Note (Addendum)
LDL not at goal  On red yeast rice, max   Will plan to make additional changes in diet ... if not at goal in 3 months consider starting statin prescription.

## 2018-05-29 NOTE — Assessment & Plan Note (Signed)
Discussed risk level and concerns about return to work.

## 2018-05-29 NOTE — Assessment & Plan Note (Signed)
Well controlled. Continue current medication. Encouraged exercise, weight loss, healthy eating habits.  

## 2018-05-29 NOTE — Assessment & Plan Note (Signed)
Improved control with weight loss

## 2018-05-29 NOTE — Patient Instructions (Addendum)
Stop aleve.   Start meloxicam daily.. call if not helping.  Consider follow up with Dr. Lorelei Pont in 2-3 months if hip still severe. Start low impact exercise. Increase water as able.

## 2018-05-29 NOTE — Progress Notes (Signed)
VIRTUAL VISIT Due to national recommendations of social distancing due to [place] 19, a virtual visit is felt to be most appropriate for this patient at this time.   I connected with the patient on 05/29/18 at  9:40 AM EDT by virtual telehealth platform and verified that I am speaking with the correct person using two identifiers.   I discussed the limitations, risks, security and privacy concerns of performing an evaluation and management service by  virtual telehealth platform and the availability of in person appointments. I also discussed with the patient that there may be a patient responsible charge related to this service. The patient expressed understanding and agreed to proceed.  Patient location: Home Provider Location: [facility] Participants: Eliezer Lofts and Gardiner Barefoot Flaten   Chief Complaint  Patient presents with  . Chronic Medical Conditions    History of Present Illness: The patientreview of chronic health problems. He/She also has the following acute concerns today: none.. continue chronic hip pain X-rays showed OA.. s/p steroid injection in hip through radiology.   Using aleve 2 qday and tylenol prn. No contraindications to NSAIDs. No SE to aleve.  Plan AMW/CPX later in the year once pandemic limits changed.  Hypertension:  Stable control on On lisinopril HCTZ, amlodipine BP Readings from Last 3 Encounters:  05/29/18 (!) 135/59  10/09/17 140/60  07/12/17 (!) 152/78  Using medication without problems or lightheadedness: none Chest pain with exertion:none Edema:none Short of breath:none, improved with weight loss Average home BPs: 117-137/59-70 Other issues:  Prediabetes Much improved with weight loss! Lab Results  Component Value Date   HGBA1C 5.7 05/27/2018   Elevated Cholesterol:  On red yeast rice, max.Marland Kitchen 8.5% 10 year risk per AHA CVD risk calculator Lab Results  Component Value Date   CHOL 179 05/27/2018   HDL 36.60 (L) 05/27/2018   LDLCALC  111 (H) 05/27/2018   LDLDIRECT 125.0 07/01/2017   TRIG 157.0 (H) 05/27/2018   CHOLHDL 5 05/27/2018  Using medications without problems: Muscle aches:  Diet compliance She has been doping weight watchers.: Exercise: limited due to pandemic and pain in hip with walking. Other complaints:  Morbid obesity  She has lost 50 lbs in last year! Body mass index is 47.22 kg/m.  Wt Readings from Last 3 Encounters:  05/29/18 (!) 306 lb (138.8 kg)  10/09/17 (!) 358 lb 8 oz (162.6 kg)  07/12/17 (!) 354 lb (160.6 kg)    COVID 19 screen No recent travel or known exposure to [place] The patient denies respiratory symptoms of COVID 19 at this time.  The importance of social distancing was discussed today.   Review of Systems  Constitutional: Negative for chills and fever.  HENT: Negative for congestion and ear pain.   Eyes: Negative for pain and redness.  Respiratory: Negative for cough and shortness of breath.   Cardiovascular: Negative for chest pain, palpitations and leg swelling.  Gastrointestinal: Negative for abdominal pain, blood in stool, constipation, diarrhea, nausea and vomiting.  Genitourinary: Negative for dysuria.  Musculoskeletal: Negative for falls and myalgias.  Skin: Negative for rash.  Neurological: Negative for dizziness.  Psychiatric/Behavioral: Negative for depression. The patient is not nervous/anxious.       Past Medical History:  Diagnosis Date  . Diverticular disease   . Hx of diverticulitis of colon 2009, 2010  . Hypertension   . Shortness of breath    on exertion    reports that she quit smoking about 7 years ago. She quit after 25.00 years  of use. She quit smokeless tobacco use about 7 years ago. She reports current alcohol use of about 1.0 standard drinks of alcohol per week. She reports that she does not use drugs.   Current Outpatient Medications:  .  amLODipine (NORVASC) 10 MG tablet, TAKE 1 TABLET BY MOUTH EVERY DAY, Disp: 90 tablet, Rfl: 0 .   CALCIUM-VITAMIN D PO, Take 1 tablet by mouth daily., Disp: , Rfl:  .  fish oil-omega-3 fatty acids 1000 MG capsule, Take 2 g by mouth daily., Disp: , Rfl:  .  lisinopril-hydrochlorothiazide (PRINZIDE,ZESTORETIC) 20-12.5 MG tablet, TAKE 2 TABLETS BY MOUTH EVERY DAY, Disp: 180 tablet, Rfl: 1 .  Multiple Vitamin (MULTIVITAMIN) tablet, Take 1 tablet by mouth daily., Disp: , Rfl:  .  Red Yeast Rice 600 MG CAPS, Take 2 capsules by mouth 2 (two) times daily., Disp: , Rfl:    Observations/Objective: Blood pressure (!) 135/59, pulse 63, height 5' 7.5" (1.715 m), weight (!) 306 lb (138.8 kg).  Physical Exam  Physical Exam Constitutional:      General: The patient is not in acute distress. Pulmonary:     Effort: Pulmonary effort is normal. No respiratory distress.  Neurological:     Mental Status: The patient is alert and oriented to person, place, and time.  Psychiatric:        Mood and Affect: Mood normal.        Behavior: Behavior normal.   Assessment and Plan HTN (hypertension) Well controlled. Continue current medication. Encouraged exercise, weight loss, healthy eating habits.   Prediabetes Improved control with weight loss   Morbid obesity with body mass index (BMI) of 50.0 to 59.9 in adult [facility]) Improving with lifestyle changes! Discussed further weight loss and overall goals for health.  High cholesterol LDL not at goal  On red yeast rice, max   Will plan to make additional changes in diet ... if not at goal in 3 months consider starting statin prescription.  Educated About Covid-19 Virus Infection Discussed risk level and concerns about return to work.     I discussed the assessment and treatment plan with the patient. The patient was provided an opportunity to ask questions and all were answered. The patient agreed with the plan and demonstrated an understanding of the instructions.   The patient was advised to call back or seek an in-person evaluation if the symptoms  worsen or if the condition fails to improve as anticipated.     Eliezer Lofts, MD

## 2018-05-29 NOTE — Assessment & Plan Note (Signed)
Improving with lifestyle changes! Discussed further weight loss and overall goals for health.

## 2018-05-30 NOTE — Progress Notes (Signed)
Left message asking pt to call office See if pt can come 9/16 with lisa and 9/29 with dr Diona Browner  If appointments are still open

## 2018-06-02 NOTE — Progress Notes (Signed)
Labs 9/1 cpx 9/8

## 2018-08-04 ENCOUNTER — Telehealth: Payer: Self-pay | Admitting: Family Medicine

## 2018-08-04 DIAGNOSIS — M1611 Unilateral primary osteoarthritis, right hip: Secondary | ICD-10-CM

## 2018-08-04 NOTE — Telephone Encounter (Signed)
Patient called and said Dr.Copland referred her to [facility] for a hip injection in February.  Patient said the injection helped, but it's worn off.  Patient wants to know if she can be referred for another injection. Dr.Bedsole put patient on Maloxicam in May and it didn't help. Patient said she can go anytime after Wednesday.

## 2018-08-05 NOTE — Telephone Encounter (Signed)
This has been ordered 

## 2018-08-15 ENCOUNTER — Ambulatory Visit
Admission: RE | Admit: 2018-08-15 | Discharge: 2018-08-15 | Disposition: A | Discharge status: Home / Self Care | Payer: Medicare HMO | Source: Ambulatory Visit | Attending: Family Medicine | Admitting: Family Medicine

## 2018-08-15 ENCOUNTER — Other Ambulatory Visit: Payer: Self-pay

## 2018-08-15 ENCOUNTER — Other Ambulatory Visit: Payer: Self-pay | Admitting: Family Medicine

## 2018-08-15 DIAGNOSIS — M25551 Pain in right hip: Secondary | ICD-10-CM | POA: Diagnosis not present

## 2018-08-15 DIAGNOSIS — M1611 Unilateral primary osteoarthritis, right hip: Secondary | ICD-10-CM

## 2018-08-15 MED ORDER — METHYLPREDNISOLONE ACETATE 40 MG/ML INJ SUSP (RADIOLOG
120.0000 mg | Freq: Once | INTRAMUSCULAR | Status: AC
  Administered 2018-08-15: 120 mg via INTRA_ARTICULAR

## 2018-08-15 MED ORDER — IOPAMIDOL (ISOVUE-M 200) INJECTION 41%
1.0000 mL | Freq: Once | INTRAMUSCULAR | Status: AC
  Administered 2018-08-15: 12:00:00 1 mL via INTRA_ARTICULAR

## 2018-08-18 ENCOUNTER — Other Ambulatory Visit: Payer: Self-pay | Admitting: Family Medicine

## 2018-08-18 NOTE — Telephone Encounter (Signed)
Last office visit 05/29/2018 for chronic medical conditions.  Last refilled 05/29/2018 for #30 with 2 refills.  CPE scheduled for 09/23/2018.

## 2018-09-16 ENCOUNTER — Other Ambulatory Visit: Payer: Self-pay

## 2018-09-16 ENCOUNTER — Other Ambulatory Visit (INDEPENDENT_AMBULATORY_CARE_PROVIDER_SITE_OTHER): Payer: Medicare HMO

## 2018-09-16 ENCOUNTER — Telehealth: Payer: Self-pay | Admitting: Family Medicine

## 2018-09-16 DIAGNOSIS — R7303 Prediabetes: Secondary | ICD-10-CM | POA: Diagnosis not present

## 2018-09-16 DIAGNOSIS — E7800 Pure hypercholesterolemia, unspecified: Secondary | ICD-10-CM

## 2018-09-16 LAB — COMPREHENSIVE METABOLIC PANEL
ALT: 78 U/L — ABNORMAL HIGH (ref 0–35)
AST: 53 U/L — ABNORMAL HIGH (ref 0–37)
Albumin: 4 g/dL (ref 3.5–5.2)
Alkaline Phosphatase: 320 U/L — ABNORMAL HIGH (ref 39–117)
BUN: 21 mg/dL (ref 6–23)
CO2: 28 mEq/L (ref 19–32)
Calcium: 9.9 mg/dL (ref 8.4–10.5)
Chloride: 103 mEq/L (ref 96–112)
Creatinine, Ser: 0.98 mg/dL (ref 0.40–1.20)
GFR: 56.43 mL/min — ABNORMAL LOW (ref >60.00)
Glucose, Bld: 88 mg/dL (ref 70–99)
Potassium: 4.4 mEq/L (ref 3.5–5.1)
Sodium: 138 mEq/L (ref 135–145)
Total Bilirubin: 0.4 mg/dL (ref 0.2–1.2)
Total Protein: 7.6 g/dL (ref 6.0–8.3)

## 2018-09-16 LAB — HEMOGLOBIN A1C: Hgb A1c MFr Bld: 5.7 % (ref 4.6–6.5)

## 2018-09-16 LAB — LIPID PANEL
Cholesterol: 166 mg/dL (ref 0–200)
HDL: 43 mg/dL (ref >39.00)
LDL Cholesterol: 97 mg/dL (ref 0–99)
NonHDL: 123.13
Total CHOL/HDL Ratio: 4
Triglycerides: 131 mg/dL (ref 0.0–149.0)
VLDL: 26.2 mg/dL (ref 0.0–40.0)

## 2018-09-16 NOTE — Telephone Encounter (Signed)
-----   Message from [name] sent at 09/09/2018 11:08 AM EDT ----- Regarding: Lab orders for Tuesday, 9.1.20  AWV lab orders, please.

## 2018-09-17 ENCOUNTER — Telehealth: Payer: Self-pay

## 2018-09-17 NOTE — Telephone Encounter (Signed)
Can continue for now. Will re-eval liver function at [facility] upcoming.

## 2018-09-17 NOTE — Telephone Encounter (Signed)
Ms. Labella notified as instructed by telephone.

## 2018-09-17 NOTE — Telephone Encounter (Signed)
Pt left v/m; pt notified yesterday to stop Tylenol. Pt wants to know if should stop Meloxicam?

## 2018-09-23 ENCOUNTER — Other Ambulatory Visit: Payer: Self-pay

## 2018-09-23 ENCOUNTER — Encounter: Payer: Self-pay | Admitting: Family Medicine

## 2018-09-23 ENCOUNTER — Ambulatory Visit (INDEPENDENT_AMBULATORY_CARE_PROVIDER_SITE_OTHER): Payer: Medicare HMO | Admitting: Family Medicine

## 2018-09-23 VITALS — BP 130/60 | HR 83 | Temp 98.7°F | Ht 67.0 in | Wt 273.5 lb

## 2018-09-23 DIAGNOSIS — Z0000 Encounter for general adult medical examination without abnormal findings: Secondary | ICD-10-CM | POA: Diagnosis not present

## 2018-09-23 DIAGNOSIS — I10 Essential (primary) hypertension: Secondary | ICD-10-CM

## 2018-09-23 DIAGNOSIS — R945 Abnormal results of liver function studies: Secondary | ICD-10-CM

## 2018-09-23 DIAGNOSIS — E7800 Pure hypercholesterolemia, unspecified: Secondary | ICD-10-CM | POA: Diagnosis not present

## 2018-09-23 DIAGNOSIS — R7303 Prediabetes: Secondary | ICD-10-CM | POA: Diagnosis not present

## 2018-09-23 DIAGNOSIS — Z1231 Encounter for screening mammogram for malignant neoplasm of breast: Secondary | ICD-10-CM

## 2018-09-23 DIAGNOSIS — Z6841 Body Mass Index (BMI) 40.0 and over, adult: Secondary | ICD-10-CM | POA: Diagnosis not present

## 2018-09-23 DIAGNOSIS — R7989 Other specified abnormal findings of blood chemistry: Secondary | ICD-10-CM | POA: Insufficient documentation

## 2018-09-23 DIAGNOSIS — E348 Other specified endocrine disorders: Secondary | ICD-10-CM

## 2018-09-23 LAB — HEPATIC FUNCTION PANEL
ALT: 54 U/L — ABNORMAL HIGH (ref 0–35)
AST: 37 U/L (ref 0–37)
Albumin: 4.2 g/dL (ref 3.5–5.2)
Alkaline Phosphatase: 302 U/L — ABNORMAL HIGH (ref 39–117)
Bilirubin, Direct: 0.1 mg/dL (ref 0.0–0.3)
Total Bilirubin: 0.4 mg/dL (ref 0.2–1.2)
Total Protein: 7.9 g/dL (ref 6.0–8.3)

## 2018-09-23 NOTE — Assessment & Plan Note (Signed)
Encouraged exercise, weight loss, healthy eating habits. ? ?

## 2018-09-23 NOTE — Progress Notes (Signed)
Chief Complaint  Patient presents with  . Medicare Wellness    History of Present Illness: HPI  The patient presents for annual medicare wellness, complete physical and review of chronic health problems. He/She also has the following acute concerns today: none  I have personally reviewed the Medicare Annual Wellness questionnaire and have noted 1. The patient's medical and social history 2. Their use of alcohol, tobacco or illicit drugs 3. Their current medications and supplements 4. The patient's functional ability including ADL's, fall risks, home safety risks and hearing or visual             impairment. 5. Diet and physical activities 6. Evidence for depression or mood disorders 7.         Updated provider list Cognitive evaluation was performed and recorded on pt medicare questionnaire form. The patients weight, height, BMI and visual acuity have been recorded in the chart  I have made referrals, counseling and provided education to the patient based review of the above and I have provided the pt with a written personalized care plan for preventive services.   Documentation of this information was scanned into the electronic record under the media tab.   Advance directives and end of life planning reviewed in detail with patient and documented in EMR. Patient given handout on advance care directives if needed. HCPOA and living will updated if needed.  Fall Risk  09/23/2018 07/12/2017 03/09/2016  Falls in the past year? 0 No No    Hearing Screening   Method: Audiometry   125Hz  250Hz  500Hz  1000Hz  2000Hz  3000Hz  4000Hz  6000Hz  8000Hz   Right ear:   20 20 20  20     Left ear:   20 20 20  20     Vision Screening Comments: Wears Glasses-Eye Exam 12/2017 with Dr. Rick Duff    Office Visit from 09/23/2018 in [place] [facility] Total Score  0       She has lost 80 lbs in last year!! Body mass index is 42.84 kg/m. Wt Readings from Last 3 Encounters:  09/23/18  273 lb 8 oz (124.1 kg)  05/29/18 (!) 306 lb (138.8 kg)  10/09/17 (!) 358 lb 8 oz (162.6 kg)    Hypertension:   Good control on amlodipine and lisinopril HCTZ  BP Readings from Last 3 Encounters:  09/23/18 130/60  05/29/18 (!) 135/59  10/09/17 140/60  Using medication without problems or lightheadedness:  none Chest pain with exertion:none Edema:none Short of breath:none Average home BPs: good Other issues:   prediabetes .. almost in nml range Lab Results  Component Value Date   HGBA1C 5.7 09/16/2018   Elevated Cholesterol:  LDL at goal < 100 on red yeast rice. Lab Results  Component Value Date   CHOL 166 09/16/2018   HDL 43.00 09/16/2018   LDLCALC 97 09/16/2018   LDLDIRECT 125.0 07/01/2017   TRIG 131.0 09/16/2018   CHOLHDL 4 09/16/2018  Using medications without problems: Muscle aches:  Diet compliance: weight watcher Exercise: off and on given hip issues. Using fit bit. Working on upper body strength. Other complaints: She has started having more energy overall.    Elevated LFT:  She has been taking tylenol  Three times daily for the prior 3 weeks but has now stopped in last 7 days.  No ETOH  No abd pain.  No family history of liver issues.  Started red yeast rice in last year.  COVID 19 screen No recent travel or known exposure to COVID19 The  patient denies respiratory symptoms of COVID 19 at this time.  The importance of social distancing was discussed today.   ROS    Past Medical History:  Diagnosis Date  . Diverticular disease   . Hx of diverticulitis of colon 2009, 2010  . Hypertension   . Shortness of breath    on exertion    reports that she quit smoking about 8 years ago. She quit after 25.00 years of use. She quit smokeless tobacco use about 8 years ago. She reports current alcohol use of about 1.0 standard drinks of alcohol per week. She reports that she does not use drugs.   Current Outpatient Medications:  .  amLODipine (NORVASC) 10 MG  tablet, TAKE 1 TABLET BY MOUTH EVERY DAY, Disp: 90 tablet, Rfl: 1 .  CALCIUM-VITAMIN D PO, Take 1 tablet by mouth daily., Disp: , Rfl:  .  fish oil-omega-3 fatty acids 1000 MG capsule, Take 2 g by mouth daily., Disp: , Rfl:  .  lisinopril-hydrochlorothiazide (PRINZIDE,ZESTORETIC) 20-12.5 MG tablet, TAKE 2 TABLETS BY MOUTH EVERY DAY, Disp: 180 tablet, Rfl: 1 .  meloxicam (MOBIC) 15 MG tablet, TAKE 1 TABLET BY MOUTH EVERY DAY, Disp: 30 tablet, Rfl: 2 .  Misc Natural Products (TART CHERRY ADVANCED PO), Take 1 capsule by mouth daily., Disp: , Rfl:  .  Multiple Vitamin (MULTIVITAMIN) tablet, Take 1 tablet by mouth daily., Disp: , Rfl:  .  Red Yeast Rice 600 MG CAPS, Take 2 capsules by mouth 2 (two) times daily., Disp: , Rfl:    Observations/Objective: Blood pressure 130/60, pulse 83, temperature 98.7 F (37.1 C), temperature source Temporal, height 5\' 7"  (1.702 m), weight 273 lb 8 oz (124.1 kg), SpO2 98 %.  Physical Exam Constitutional:      General: She is not in acute distress.    Appearance: Normal appearance. She is well-developed. She is obese. She is not ill-appearing or toxic-appearing.  HENT:     Head: Normocephalic.     Right Ear: Hearing, tympanic membrane, ear canal and external ear normal. Tympanic membrane is not erythematous, retracted or bulging.     Left Ear: Hearing, tympanic membrane, ear canal and external ear normal. Tympanic membrane is not erythematous, retracted or bulging.     Nose: No mucosal edema or rhinorrhea.     Right Sinus: No maxillary sinus tenderness or frontal sinus tenderness.     Left Sinus: No maxillary sinus tenderness or frontal sinus tenderness.     Mouth/Throat:     Pharynx: Uvula midline.  Eyes:     General: Lids are normal. Lids are everted, no foreign bodies appreciated.     Conjunctiva/sclera: Conjunctivae normal.     Pupils: Pupils are equal, round, and reactive to light.  Neck:     Musculoskeletal: Normal range of motion and neck supple.      Thyroid: No thyroid mass or thyromegaly.     Vascular: No carotid bruit.     Trachea: Trachea normal.  Cardiovascular:     Rate and Rhythm: Normal rate and regular rhythm.     Pulses: Normal pulses.     Heart sounds: Normal heart sounds, S1 normal and S2 normal. No murmur. No friction rub. No gallop.   Pulmonary:     Effort: Pulmonary effort is normal. No tachypnea or respiratory distress.     Breath sounds: Normal breath sounds. No decreased breath sounds, wheezing, rhonchi or rales.  Abdominal:     General: Bowel sounds are normal.  Palpations: Abdomen is soft.     Tenderness: There is no abdominal tenderness.  Skin:    General: Skin is warm and dry.     Findings: No rash.  Neurological:     Mental Status: She is alert.  Psychiatric:        Mood and Affect: Mood is not anxious or depressed.        Speech: Speech normal.        Behavior: Behavior normal. Behavior is cooperative.        Thought Content: Thought content normal.        Judgment: Judgment normal.      Assessment and Plan The patient's preventative maintenance and recommended screening tests for an annual wellness exam were reviewed in full today. Brought up to date unless services declined.  Counselled on the importance of diet, exercise, and its role in overall health and mortality. The patient's FH and SH was reviewed, including their home life, tobacco status, and drug and alcohol status.   Vaccines: Uptodate with Tdap, shingles vaccine,PNA, refused flu until later date. Mammo: nml in 12/2017 pap/dve : Pap not indicated. No DVE given no family history of ovarian uterine cancer Colon: No family history of colon cancer known.  Cologuard 07/2017 DEXA: 10/2013 normal.. Repeat in 5 years. Due after 10/2018  Hep C neg   HTN (hypertension) Well controlled. Continue current medication.   High cholesterol LDL at goal < 100 on red yeast rice. Encouraged exercise, weight loss, healthy eating  habits.   Morbid obesity with BMI of 40.0-44.9, adult ([place]) Encouraged exercise, weight loss, healthy eating habits.   Elevated LFTs  Re-eval off tylenol.    Eliezer Lofts, MD

## 2018-09-23 NOTE — Patient Instructions (Addendum)
Keep up great work on healthy eating and weight loss.  Continue to add exercise as tolerated.  Please stop at the lab to have labs drawn. Get flu shot at later time.  We will call to set up mammogram and bone density.

## 2018-09-23 NOTE — Assessment & Plan Note (Signed)
Re-eval off tylenol.

## 2018-09-23 NOTE — Assessment & Plan Note (Signed)
Well controlled. Continue current medication.  

## 2018-09-23 NOTE — Assessment & Plan Note (Signed)
LDL at goal < 100 on red yeast rice. Encouraged exercise, weight loss, healthy eating habits.

## 2018-09-25 LAB — HEPATITIS PANEL, ACUTE
Hep A IgM: NONREACTIVE
Hep B C IgM: NONREACTIVE
Hepatitis B Surface Ag: NONREACTIVE
Hepatitis C Ab: NONREACTIVE
SIGNAL TO CUT-OFF: 0.1 (ref <1.00)

## 2018-09-27 ENCOUNTER — Other Ambulatory Visit: Payer: Self-pay | Admitting: Family Medicine

## 2018-10-07 ENCOUNTER — Other Ambulatory Visit (INDEPENDENT_AMBULATORY_CARE_PROVIDER_SITE_OTHER): Payer: Medicare HMO

## 2018-10-07 ENCOUNTER — Telehealth: Payer: Self-pay | Admitting: Family Medicine

## 2018-10-07 DIAGNOSIS — R945 Abnormal results of liver function studies: Secondary | ICD-10-CM | POA: Diagnosis not present

## 2018-10-07 DIAGNOSIS — R7989 Other specified abnormal findings of blood chemistry: Secondary | ICD-10-CM

## 2018-10-07 LAB — HEPATIC FUNCTION PANEL
ALT: 35 U/L (ref 0–35)
AST: 30 U/L (ref 0–37)
Albumin: 4.1 g/dL (ref 3.5–5.2)
Alkaline Phosphatase: 209 U/L — ABNORMAL HIGH (ref 39–117)
Bilirubin, Direct: 0 mg/dL (ref 0.0–0.3)
Total Bilirubin: 0.4 mg/dL (ref 0.2–1.2)
Total Protein: 7.7 g/dL (ref 6.0–8.3)

## 2018-10-07 NOTE — Telephone Encounter (Signed)
-----   Message from [name] sent at 09/30/2018 10:59 AM EDT ----- Regarding: Lab orders for Tuesday, 9.22.20 Lab orders for 2 week f/u

## 2018-10-17 ENCOUNTER — Telehealth: Payer: Self-pay | Admitting: *Deleted

## 2018-10-17 NOTE — Telephone Encounter (Signed)
Use meloxicam but if pain not controlled we will refer to [facility] for eval and possible injection in hip. No tylenol.  OK to have occasional drink.  Restart red yeast rice.

## 2018-10-17 NOTE — Telephone Encounter (Signed)
Patient left a voicemail stating that she has some questions about her recent lab work?  Patient stated that she was advised not to take Tylenol?  Patient wants to know what she can take for hip pain along with the Meloxicam? Patient stated that she wants to know if she should start back taking red yeast rice? Patient wants to know if it is okay to occassionally have a drink?

## 2018-10-17 NOTE — Telephone Encounter (Signed)
[name] notified as instructed by telephone.  Patient states she will continue with the Meloxicam for now and give it a little more time to work.   If pain is not controlled, she will call us back for the Ortho referral.

## 2018-10-28 ENCOUNTER — Ambulatory Visit (INDEPENDENT_AMBULATORY_CARE_PROVIDER_SITE_OTHER): Payer: Medicare HMO

## 2018-10-28 DIAGNOSIS — Z23 Encounter for immunization: Secondary | ICD-10-CM | POA: Diagnosis not present

## 2018-10-29 ENCOUNTER — Telehealth: Payer: Self-pay | Admitting: Family Medicine

## 2018-10-29 DIAGNOSIS — G8929 Other chronic pain: Secondary | ICD-10-CM

## 2018-10-29 NOTE — Telephone Encounter (Signed)
Pt called wanting to get a referral to ortho for hip pain  Best number [phone]

## 2018-10-30 ENCOUNTER — Ambulatory Visit: Payer: Medicare HMO

## 2018-10-30 NOTE — Telephone Encounter (Signed)
Referral sent 

## 2018-11-07 ENCOUNTER — Telehealth: Payer: Self-pay | Admitting: Family Medicine

## 2018-11-07 DIAGNOSIS — M25551 Pain in right hip: Secondary | ICD-10-CM | POA: Diagnosis not present

## 2018-11-07 DIAGNOSIS — M1611 Unilateral primary osteoarthritis, right hip: Secondary | ICD-10-CM | POA: Diagnosis not present

## 2018-11-07 NOTE — Telephone Encounter (Signed)
Patient dropped off form for Surgical Clearance by Dr.Brian Swintck. Patient had her AWV on 09/23/18.  Form is in rx tower.

## 2018-11-07 NOTE — Telephone Encounter (Signed)
In your inbox.

## 2018-11-09 ENCOUNTER — Other Ambulatory Visit: Payer: Self-pay | Admitting: Family Medicine

## 2018-11-10 NOTE — Telephone Encounter (Signed)
Last office visit 09/23/2018 for [place].  Last refilled 08/19/2018 for #30 with 2 refills.   No future appointments with PCP.

## 2018-11-11 ENCOUNTER — Telehealth: Payer: Self-pay | Admitting: Family Medicine

## 2018-11-11 NOTE — Telephone Encounter (Signed)
Opened in error

## 2018-11-11 NOTE — Telephone Encounter (Signed)
Pre op form eval.   I reviewed the form from Dr. Lyla Glassing.. per the form,  they require BMI< 40.. despite her weight loss she is at  BMI 42 at [facility] visit. She can call the ORTHO to see if they truly require this. Also they require... EKG, cbc, A1C and urinalysis. She has not had these in the last 6 months.  She meets the other criteria and has had the other labs done at the [facility].   Please have her come in for OV to have this all completed if she has already lost further weight and is at BMI < 40.  Otherwise she can make an appt to discuss further weight loss methods.

## 2018-11-12 NOTE — Telephone Encounter (Signed)
Cassie Nelson notified as instructed by telephone.  Pre-Op appointment scheduled for 11/14/2018 at 9:40 am.

## 2018-11-14 ENCOUNTER — Ambulatory Visit (INDEPENDENT_AMBULATORY_CARE_PROVIDER_SITE_OTHER): Payer: Medicare HMO | Admitting: Family Medicine

## 2018-11-14 ENCOUNTER — Encounter: Payer: Self-pay | Admitting: Family Medicine

## 2018-11-14 ENCOUNTER — Other Ambulatory Visit: Payer: Self-pay

## 2018-11-14 VITALS — BP 140/60 | HR 88 | Temp 98.1°F | Ht 67.0 in | Wt 261.2 lb

## 2018-11-14 DIAGNOSIS — R3915 Urgency of urination: Secondary | ICD-10-CM | POA: Diagnosis not present

## 2018-11-14 DIAGNOSIS — Z01818 Encounter for other preprocedural examination: Secondary | ICD-10-CM | POA: Diagnosis not present

## 2018-11-14 DIAGNOSIS — R7303 Prediabetes: Secondary | ICD-10-CM | POA: Diagnosis not present

## 2018-11-14 DIAGNOSIS — I10 Essential (primary) hypertension: Secondary | ICD-10-CM

## 2018-11-14 LAB — POC URINALSYSI DIPSTICK (AUTOMATED)
Bilirubin, UA: NEGATIVE
Blood, UA: NEGATIVE
Glucose, UA: NEGATIVE
Ketones, UA: NEGATIVE
Nitrite, UA: NEGATIVE
Protein, UA: POSITIVE — AB
Spec Grav, UA: 1.015 (ref 1.010–1.025)
Urobilinogen, UA: 0.2 E.U./dL
pH, UA: 7 (ref 5.0–8.0)

## 2018-11-14 LAB — COMPREHENSIVE METABOLIC PANEL
ALT: 27 U/L (ref 0–35)
AST: 23 U/L (ref 0–37)
Albumin: 4.1 g/dL (ref 3.5–5.2)
Alkaline Phosphatase: 147 U/L — ABNORMAL HIGH (ref 39–117)
BUN: 21 mg/dL (ref 6–23)
CO2: 25 mEq/L (ref 19–32)
Calcium: 9.7 mg/dL (ref 8.4–10.5)
Chloride: 103 mEq/L (ref 96–112)
Creatinine, Ser: 1.02 mg/dL (ref 0.40–1.20)
GFR: 53.86 mL/min — ABNORMAL LOW (ref >60.00)
Glucose, Bld: 91 mg/dL (ref 70–99)
Potassium: 4.3 mEq/L (ref 3.5–5.1)
Sodium: 136 mEq/L (ref 135–145)
Total Bilirubin: 0.4 mg/dL (ref 0.2–1.2)
Total Protein: 7.2 g/dL (ref 6.0–8.3)

## 2018-11-14 LAB — CBC WITH DIFFERENTIAL/PLATELET
Basophils Absolute: 0.1 10*3/uL (ref 0.0–0.1)
Basophils Relative: 1.1 % (ref 0.0–3.0)
Eosinophils Absolute: 0.5 10*3/uL (ref 0.0–0.7)
Eosinophils Relative: 5.2 % — ABNORMAL HIGH (ref 0.0–5.0)
HCT: 37.6 % (ref 36.0–46.0)
Hemoglobin: 12.5 g/dL (ref 12.0–15.0)
Lymphocytes Relative: 19.6 % (ref 12.0–46.0)
Lymphs Abs: 1.9 10*3/uL (ref 0.7–4.0)
MCHC: 33.2 g/dL (ref 30.0–36.0)
MCV: 90.1 fl (ref 78.0–100.0)
Monocytes Absolute: 0.8 10*3/uL (ref 0.1–1.0)
Monocytes Relative: 8.1 % (ref 3.0–12.0)
Neutro Abs: 6.5 10*3/uL (ref 1.4–7.7)
Neutrophils Relative %: 66 % (ref 43.0–77.0)
Platelets: 338 10*3/uL (ref 150.0–400.0)
RBC: 4.18 Mil/uL (ref 3.87–5.11)
RDW: 14.3 % (ref 11.5–15.5)
WBC: 9.9 10*3/uL (ref 4.0–10.5)

## 2018-11-14 LAB — HEMOGLOBIN A1C: Hgb A1c MFr Bld: 5.4 % (ref 4.6–6.5)

## 2018-11-14 NOTE — Addendum Note (Signed)
Addended by: Carter Kitten on: 11/14/2018 10:40 AM   Modules accepted: Orders

## 2018-11-14 NOTE — Assessment & Plan Note (Signed)
Pt working on weight loss... should soon be at BMI < 40 if weight loss continues as currently.  No red flags or contraindications to surgery.  Await labs and Urine culture result.

## 2018-11-14 NOTE — Progress Notes (Signed)
Chief Complaint  Patient presents with  . Surgical Clearance    History of Present Illness: HPI  68 year old female with morbid obesity, HTN, high cholesterol and prediabetes presents for pre-op eval for R total hip arthroplasty.  Requirements per surgeon... BMI < 40, A1C > 11, albumin > 3, A1C < 7.5.  Hypertension:   tolerable control on lisinopril HCTZ.  BP Readings from Last 3 Encounters:  11/14/18 140/60  09/23/18 130/60  05/29/18 (!) 135/59  Using medication without problems or lightheadedness: none Chest pain with exertion:none Edema:none Short of breath:none Average home BPs: 120-140/60-80 Other issues:  Wt Readings from Last 3 Encounters:  11/14/18 261 lb 4 oz (118.5 kg)  09/23/18 273 lb 8 oz (124.1 kg)  05/29/18 (!) 306 lb (138.8 kg)    EKG: normal EKG, normal sinus rhythm, unchanged from previous tracings 2014.  UA shows positive protein and moderate LE... no dysuria, no change in frequency, some urine holding issues in last few months. Some increase in urgency Wearing a pad   no past complications with D and C in 2014.  COVID 19 screen No recent travel or known exposure to COVID19 The patient denies respiratory symptoms of COVID 19 at this time.  The importance of social distancing was discussed today.   Review of Systems  Constitutional: Negative for chills and fever.  HENT: Negative for congestion and ear pain.   Eyes: Negative for pain and redness.  Respiratory: Negative for cough and shortness of breath.   Cardiovascular: Negative for chest pain, palpitations and leg swelling.  Gastrointestinal: Negative for abdominal pain, blood in stool, constipation, diarrhea, nausea and vomiting.  Genitourinary: Negative for dysuria.  Musculoskeletal: Negative for falls and myalgias.  Skin: Negative for rash.  Neurological: Negative for dizziness.  Psychiatric/Behavioral: Negative for depression. The patient is not nervous/anxious.       Past Medical History:   Diagnosis Date  . Diverticular disease   . Hx of diverticulitis of colon 2009, 2010  . Hypertension   . Shortness of breath    on exertion    reports that she quit smoking about 8 years ago. She quit after 25.00 years of use. She quit smokeless tobacco use about 8 years ago. She reports current alcohol use of about 1.0 standard drinks of alcohol per week. She reports that she does not use drugs.   Current Outpatient Medications:  .  amLODipine (NORVASC) 10 MG tablet, TAKE 1 TABLET BY MOUTH EVERY DAY, Disp: 90 tablet, Rfl: 1 .  CALCIUM-VITAMIN D PO, Take 1 tablet by mouth daily., Disp: , Rfl:  .  fish oil-omega-3 fatty acids 1000 MG capsule, Take 2 g by mouth daily., Disp: , Rfl:  .  lisinopril-hydrochlorothiazide (ZESTORETIC) 20-12.5 MG tablet, TAKE 2 TABLETS BY MOUTH EVERY DAY, Disp: 180 tablet, Rfl: 1 .  meloxicam (MOBIC) 15 MG tablet, TAKE 1 TABLET BY MOUTH EVERY DAY, Disp: 30 tablet, Rfl: 2 .  Misc Natural Products (TART CHERRY ADVANCED PO), Take 1 capsule by mouth daily., Disp: , Rfl:  .  Multiple Vitamin (MULTIVITAMIN) tablet, Take 1 tablet by mouth daily., Disp: , Rfl:  .  Red Yeast Rice 600 MG CAPS, Take 2 capsules by mouth 2 (two) times daily., Disp: , Rfl:    Observations/Objective: Blood pressure 140/60, pulse 88, temperature 98.1 F (36.7 C), temperature source Temporal, height 5\' 7"  (1.702 m), weight 261 lb 4 oz (118.5 kg), SpO2 98 %.  Physical Exam Constitutional:      General: She  is not in acute distress.    Appearance: Normal appearance. She is well-developed. She is obese. She is not ill-appearing or toxic-appearing.  HENT:     Head: Normocephalic.     Right Ear: Hearing, tympanic membrane, ear canal and external ear normal.     Left Ear: Hearing, tympanic membrane, ear canal and external ear normal.     Nose: Nose normal.  Eyes:     General: Lids are normal. Lids are everted, no foreign bodies appreciated.     Conjunctiva/sclera: Conjunctivae normal.      Pupils: Pupils are equal, round, and reactive to light.  Neck:     Musculoskeletal: Normal range of motion and neck supple.     Thyroid: No thyroid mass or thyromegaly.     Vascular: No carotid bruit.     Trachea: Trachea normal.  Cardiovascular:     Rate and Rhythm: Normal rate and regular rhythm.     Heart sounds: Normal heart sounds, S1 normal and S2 normal. No murmur. No gallop.   Pulmonary:     Effort: Pulmonary effort is normal. No respiratory distress.     Breath sounds: Normal breath sounds. No wheezing, rhonchi or rales.  Abdominal:     General: Bowel sounds are normal. There is no distension or abdominal bruit.     Palpations: Abdomen is soft. There is no fluid wave or mass.     Tenderness: There is no abdominal tenderness. There is no guarding or rebound.     Hernia: No hernia is present.  Lymphadenopathy:     Cervical: No cervical adenopathy.  Skin:    General: Skin is warm and dry.     Findings: No rash.  Neurological:     Mental Status: She is alert.     Cranial Nerves: No cranial nerve deficit.     Sensory: No sensory deficit.  Psychiatric:        Mood and Affect: Mood is not anxious or depressed.        Speech: Speech normal.        Behavior: Behavior normal. Behavior is cooperative.        Judgment: Judgment normal.      Assessment and Plan   Preoperative clearance  Pt working on weight loss... should soon be at BMI < 40 if weight loss continues as currently.  No red flags or contraindications to surgery.  Await labs and Urine culture result.     Eliezer Lofts, MD

## 2018-11-14 NOTE — Assessment & Plan Note (Signed)
Well controlled. Continue current medication.  

## 2018-11-16 LAB — URINE CULTURE
MICRO NUMBER:: 1049287
SPECIMEN QUALITY:: ADEQUATE

## 2018-11-21 ENCOUNTER — Ambulatory Visit: Payer: Self-pay | Admitting: Orthopedic Surgery

## 2018-12-09 ENCOUNTER — Ambulatory Visit: Payer: Self-pay | Admitting: Orthopedic Surgery

## 2018-12-09 NOTE — Patient Instructions (Addendum)
DUE TO COVID-19 ONLY ONE VISITOR IS ALLOWED TO COME WITH YOU AND STAY IN THE WAITING ROOM ONLY DURING PRE OP AND PROCEDURE. THE ONE VISITOR MAY VISIT WITH YOU IN YOUR PRIVATE ROOM DURING VISITING HOURS ONLY!!   COVID SWAB TESTING MUST BE COMPLETED ON:   12/13/2018  [place], [place] [facility] enter pre surgical testing line (Must self quarantine after testing. Follow instructions on handout.)              Your procedure is scheduled on: 12/17/2018   Report to [facility] Main  Entrance   Report to admitting at 6:00 AM   Call this number if you have problems the morning of surgery [phone]  NO SOLID FOOD AFTER MIDNIGHT THE NIGHT PRIOR TO SURGERY. NOTHING BY MOUTH EXCEPT CLEAR LIQUIDS UNTIL 5:30 AM . PLEASE FINISH ENSURE DRINK PER SURGEON ORDER  WHICH NEEDS TO BE COMPLETED AT 5:30 AM.    CLEAR LIQUID DIET   Foods Allowed                                                                     Foods Excluded  Coffee and tea, regular and decaf                             liquids that you cannot  Plain Jell-O any favor except red or purple                                           see through such as: Fruit ices (not with fruit pulp)                                     milk, soups, orange juice  Iced Popsicles                                    All solid food Carbonated beverages, regular and diet                                    Cranberry, grape and apple juices Sports drinks like Gatorade Lightly seasoned clear broth or consume(fat free) Sugar, honey syrup  _____________________________________________________________________      Take these medicines the morning of surgery with A SIP OF WATER: None   Brush your teeth the morning of surgery.                                You may not have any metal on your body including hair pins, jewelry, and body piercing              Do not wear make-up, lotions, powders, perfumes/cologne, or  deodorant              Do not wear nail polish.  Do not shave  48 hours prior to surgery.         Do not bring valuables to the hospital. [place].   Contacts, dentures or bridgework may not be worn into surgery.    Patients discharged the day of surgery will not be allowed to drive home.   Name and phone number of your driver: Cassie Nelson [phone]   Special Instructions: Bring a copy of your healthcare power of attorney and living will documents the day of surgery if you haven't scanned them in before.              Please read over the following fact sheets you were given:      [facility] - Preparing for Surgery Before surgery, you can play an important role.  Because skin is not sterile, your skin needs to be as free of germs as possible.  You can reduce the number of germs on your skin by washing with CHG (chlorahexidine gluconate) soap before surgery.  CHG is an antiseptic cleaner which kills germs and bonds with the skin to continue killing germs even after washing. Please DO NOT use if you have an allergy to CHG or antibacterial soaps.  If your skin becomes reddened/irritated stop using the CHG and inform your nurse when you arrive at Short Stay. Do not shave (including legs and underarms) for at least 48 hours prior to the first CHG shower.  You may shave your face/neck.  Please follow these instructions carefully:  1.  Shower with CHG Soap the night before surgery and the  morning of surgery.  2.  If you choose to wash your hair, wash your hair first as usual with your normal  shampoo.  3.  After you shampoo, rinse your hair and body thoroughly to remove the shampoo.                             4.  Use CHG as you would any other liquid soap.  You can apply chg directly to the skin and wash.  Gently with a scrungie or clean washcloth.  5.  Apply the CHG Soap to your body ONLY FROM THE NECK DOWN.   Do   not use on face/  open                           Wound or open sores. Avoid contact with eyes, ears mouth and   genitals (private parts).                       Wash face,  Genitals (private parts) with your normal soap.             6.  Wash thoroughly, paying special attention to the area where your    surgery  will be performed.  7.  Thoroughly rinse your body with warm water from the neck down.  8.  DO NOT shower/wash with your normal soap after using and rinsing off the CHG Soap.                9.  Pat yourself dry with a clean towel.            10.  Wear clean pajamas.  11.  Place clean sheets on your bed the night of your first shower and do not  sleep with pets. Day of Surgery : Do not apply any lotions/deodorants the morning of surgery.  Please wear clean clothes to the hospital/surgery center.  FAILURE TO FOLLOW THESE INSTRUCTIONS MAY RESULT IN THE CANCELLATION OF YOUR SURGERY  PATIENT SIGNATURE_________________________________  NURSE SIGNATURE__________________________________  ________________________________________________________________________   Cassie Nelson  An incentive spirometer is a tool that can help keep your lungs clear and active. This tool measures how well you are filling your lungs with each breath. Taking long deep breaths may help reverse or decrease the chance of developing breathing (pulmonary) problems (especially infection) following:  A long period of time when you are unable to move or be active. BEFORE THE PROCEDURE   If the spirometer includes an indicator to show your best effort, your nurse or respiratory therapist will set it to a desired goal.  If possible, sit up straight or lean slightly forward. Try not to slouch.  Hold the incentive spirometer in an upright position. INSTRUCTIONS FOR USE  1. Sit on the edge of your bed if possible, or sit up as far as you can in bed or on a chair. 2. Hold the incentive spirometer in an upright  position. 3. Breathe out normally. 4. Place the mouthpiece in your mouth and seal your lips tightly around it. 5. Breathe in slowly and as deeply as possible, raising the piston or the ball toward the top of the column. 6. Hold your breath for 3-5 seconds or for as long as possible. Allow the piston or ball to fall to the bottom of the column. 7. Remove the mouthpiece from your mouth and breathe out normally. 8. Rest for a few seconds and repeat Steps 1 through 7 at least 10 times every 1-2 hours when you are awake. Take your time and take a few normal breaths between deep breaths. 9. The spirometer may include an indicator to show your best effort. Use the indicator as a goal to work toward during each repetition. 10. After each set of 10 deep breaths, practice coughing to be sure your lungs are clear. If you have an incision (the cut made at the time of surgery), support your incision when coughing by placing a pillow or rolled up towels firmly against it. Once you are able to get out of bed, walk around indoors and cough well. You may stop using the incentive spirometer when instructed by your caregiver.  RISKS AND COMPLICATIONS  Take your time so you do not get dizzy or light-headed.  If you are in pain, you may need to take or ask for pain medication before doing incentive spirometry. It is harder to take a deep breath if you are having pain. AFTER USE  Rest and breathe slowly and easily.  It can be helpful to keep track of a log of your progress. Your caregiver can provide you with a simple table to help with this. If you are using the spirometer at home, follow these instructions: [place] IF:   You are having difficultly using the spirometer.  You have trouble using the spirometer as often as instructed.  Your pain medication is not giving enough relief while using the spirometer.  You develop fever of 100.5 F (38.1 C) or higher. SEEK IMMEDIATE MEDICAL CARE IF:    You cough up bloody sputum that had not been present before.  You develop fever of 102 F (38.9  C) or greater.  You develop worsening pain at or near the incision site. MAKE SURE YOU:   Understand these instructions.  Will watch your condition.  Will get help right away if you are not doing well or get worse. Document Released: 05/14/2006 Document Revised: 03/26/2011 Document Reviewed: 07/15/2006 Exit[employer] Patient Information 2014 Exit[employer], [place].   ________________________________________________________________________  WHAT IS A BLOOD TRANSFUSION? Blood Transfusion Information  A transfusion is the replacement of blood or some of its parts. Blood is made up of multiple cells which provide different functions.  Red blood cells carry oxygen and are used for blood loss replacement.  White blood cells fight against infection.  Platelets control bleeding.  Plasma helps clot blood.  Other blood products are available for specialized needs, such as hemophilia or other clotting disorders. BEFORE THE TRANSFUSION  Who gives blood for transfusions?   Healthy volunteers who are fully evaluated to make sure their blood is safe. This is blood bank blood. Transfusion therapy is the safest it has ever been in the practice of medicine. Before blood is taken from a donor, a complete history is taken to make sure that person has no history of diseases nor engages in risky social behavior (examples are intravenous drug use or sexual activity with multiple partners). The donor's travel history is screened to minimize risk of transmitting infections, such as malaria. The donated blood is tested for signs of infectious diseases, such as HIV and hepatitis. The blood is then tested to be sure it is compatible with you in order to minimize the chance of a transfusion reaction. If you or a relative donates blood, this is often done in anticipation of surgery and is not appropriate for emergency  situations. It takes many days to process the donated blood. RISKS AND COMPLICATIONS Although transfusion therapy is very safe and saves many lives, the main dangers of transfusion include:   Getting an infectious disease.  Developing a transfusion reaction. This is an allergic reaction to something in the blood you were given. Every precaution is taken to prevent this. The decision to have a blood transfusion has been considered carefully by your caregiver before blood is given. Blood is not given unless the benefits outweigh the risks. AFTER THE TRANSFUSION  Right after receiving a blood transfusion, you will usually feel much better and more energetic. This is especially true if your red blood cells have gotten low (anemic). The transfusion raises the level of the red blood cells which carry oxygen, and this usually causes an energy increase.  The nurse administering the transfusion will monitor you carefully for complications. HOME CARE INSTRUCTIONS  No special instructions are needed after a transfusion. You may find your energy is better. Speak with your caregiver about any limitations on activity for underlying diseases you may have. SEEK MEDICAL CARE IF:   Your condition is not improving after your transfusion.  You develop redness or irritation at the intravenous (IV) site. SEEK IMMEDIATE MEDICAL CARE IF:  Any of the following symptoms occur over the next 12 hours:  Shaking chills.  You have a temperature by mouth above 102 F (38.9 C), not controlled by medicine.  Chest, back, or muscle pain.  People around you feel you are not acting correctly or are confused.  Shortness of breath or difficulty breathing.  Dizziness and fainting.  You get a rash or develop hives.  You have a decrease in urine output.  Your urine turns a dark color or changes to pink, red,  or brown. Any of the following symptoms occur over the next 10 days:  You have a temperature by mouth above  102 F (38.9 C), not controlled by medicine.  Shortness of breath.  Weakness after normal activity.  The white part of the eye turns yellow (jaundice).  You have a decrease in the amount of urine or are urinating less often.  Your urine turns a dark color or changes to pink, red, or brown. Document Released: 12/30/1999 Document Revised: 03/26/2011 Document Reviewed: 08/18/2007 [facility] Patient Information 2014 [place], [place].  _______________________________________________________________________

## 2018-12-09 NOTE — Progress Notes (Signed)
PCP - Eliezer Lofts w/ surgical clearance on 11/14/18 in EPIC  Cardiologist -   Chest x-ray -   EKG - 11-14-18   Stress Test -  ECHO -  Cardiac Cath -   Sleep Study -  CPAP -   Fasting Blood Sugar -  Checks Blood Sugar _____ times a day  Blood Thinner Instructions: Aspirin Instructions: Last Dose:  Anesthesia review:   Patient denies shortness of breath, fever, cough and chest pain at PAT appointment  FYI-Pt reports rash to R groin. Dr. Lyla Glassing is aware, and have given her a 'powder' to apply to area to assist with absorption of moisture, and healing of rash.   Patient verbalized understanding of instructions that were given to them at the PAT appointment. Patient was also instructed that they will need to review over the PAT instructions again at home before surgery.

## 2018-12-09 NOTE — H&P (View-Only) (Signed)
TOTAL HIP ADMISSION H&P  Patient is admitted for right total hip arthroplasty.  Subjective:  Chief Complaint: right hip pain  HPI: Cassie Nelson, 68 y.o. female, has a history of pain and functional disability in the right hip(s) due to arthritis and patient has failed non-surgical conservative treatments for greater than 12 weeks to include NSAID's and/or analgesics, flexibility and strengthening excercises, use of assistive devices, weight reduction as appropriate and activity modification.  Onset of symptoms was gradual starting 5 years ago with rapidlly worsening course since that time.The patient noted no past surgery on the right hip(s).  Patient currently rates pain in the right hip at 10 out of 10 with activity. Patient has night pain, worsening of pain with activity and weight bearing, trendelenberg gait, pain that interfers with activities of daily living, pain with passive range of motion and crepitus. Patient has evidence of subchondral cysts, subchondral sclerosis, periarticular osteophytes and joint space narrowing by imaging studies. This condition presents safety issues increasing the risk of falls.  There is no current active infection.  Patient Active Problem List   Diagnosis Date Noted  . Preoperative clearance 11/14/2018  . Elevated LFTs 09/23/2018  . Chronic right hip pain 06/27/2017  . Morbid obesity with BMI of 40.0-44.9, adult ([place]) 01/06/2015  . Metabolic syndrome 99991111  . Diverticulosis 10/20/2013  . High cholesterol 06/20/2012  . Prediabetes 06/20/2012  . Stress incontinence 05/22/2012  . HTN (hypertension) 05/14/2012   Past Medical History:  Diagnosis Date  . Diverticular disease   . Hx of diverticulitis of colon 2009, 2010  . Hypertension   . Shortness of breath    on exertion    Past Surgical History:  Procedure Laterality Date  . DILITATION & CURRETTAGE/HYSTROSCOPY WITH NOVASURE ABLATION N/A 07/03/2012   Procedure: DILATATION &  CURETTAGE/HYSTEROSCOPY WITH NOVASURE ABLATION;  Surgeon: Lavonia Drafts, MD;  Location: [facility];  Service: Gynecology;  Laterality: N/A;  . 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 MOUTH SURGERY     biopsy of cheek  . TONSILLECTOMY      Current Outpatient Medications  Medication Sig Dispense Refill Last Dose  . amLODipine (NORVASC) 10 MG tablet TAKE 1 TABLET BY MOUTH EVERY DAY (Patient taking differently: Take 10 mg by mouth at bedtime. ) 90 tablet 1 Taking  . CALCIUM-VITAMIN D PO Take 600 mg by mouth daily.    Taking  . lisinopril-hydrochlorothiazide (ZESTORETIC) 20-12.5 MG tablet TAKE 2 TABLETS BY MOUTH EVERY DAY (Patient taking differently: Take 2 tablets by mouth daily. ) 180 tablet 1 Taking  . meloxicam (MOBIC) 15 MG tablet TAKE 1 TABLET BY MOUTH EVERY DAY (Patient taking differently: Take 15 mg by mouth daily. ) 30 tablet 2 Taking  . Misc Natural Products (TART CHERRY ADVANCED) CAPS Take 1 capsule by mouth daily.    Taking  . Multiple Vitamin (MULTIVITAMIN) tablet Take 1 tablet by mouth daily. Centrum Silver   Taking  . Red Yeast Rice 600 MG CAPS Take 1,200 mg by mouth 2 (two) times daily.    Taking   No current facility-administered medications for this visit.    Allergies  Allergen Reactions  . Levofloxacin Other (See Comments)    Joint Pain    Social History   Tobacco Use  . Smoking status: Former Smoker    Years: 25.00    Quit date: 06/22/2010    Years since quitting: 8.4  . Smokeless tobacco: Former Systems developer    Quit date:  09/23/2010  Substance Use Topics  . Alcohol use: Yes    Alcohol/week: 1.0 standard drinks    Types: 1 drink(s) per week    Comment: occasionally    Family History  Problem Relation Age of Onset  . Heart disease Father   . Cancer Father 12       colon cancer  . Arthritis Father   . Hypertension Father   . Hypothyroidism Father   . Hypertension Mother   . Hypothyroidism Mother   . Breast cancer Neg Hx      Review of  Systems  Constitutional: Negative.   HENT: Negative.   Eyes: Negative.   Respiratory: Negative.   Cardiovascular: Negative.   Gastrointestinal: Negative.   Genitourinary: Negative.   Musculoskeletal: Positive for joint pain.  Skin: Negative.   Neurological: Negative.   Endo/Heme/Allergies: Negative.   Psychiatric/Behavioral: Negative.     Objective:  Physical Exam  Vitals reviewed. Constitutional: She is oriented to person, place, and time. She appears well-developed and well-nourished.  HENT:  Head: Normocephalic and atraumatic.  Eyes: Pupils are equal, round, and reactive to light. Conjunctivae and EOM are normal.  Neck: Normal range of motion. Neck supple.  Cardiovascular: Normal rate, regular rhythm and intact distal pulses.  Respiratory: Effort normal. No respiratory distress.  GI: Soft. She exhibits no distension.  Genitourinary:    Genitourinary Comments: deferred   Musculoskeletal:     Right hip: She exhibits decreased range of motion, decreased strength and bony tenderness.  Neurological: She is alert and oriented to person, place, and time. She has normal reflexes.  Skin: Skin is warm and dry.  Psychiatric: She has a normal mood and affect. Her behavior is normal. Judgment and thought content normal.    Vital signs in last 24 hours: @VSRANGES @  Labs:   Estimated body mass index is 40.92 kg/m as calculated from the following:   Height as of 11/14/18: 5\' 7"  (1.702 m).   Weight as of 11/14/18: 118.5 kg.   Imaging Review Plain radiographs demonstrate severe degenerative joint disease of the right hip(s). The bone quality appears to be adequate for age and reported activity level.      Assessment/Plan:  End stage arthritis, right hip(s)  The patient history, physical examination, clinical judgement of the provider and imaging studies are consistent with end stage degenerative joint disease of the right hip(s) and total hip arthroplasty is deemed  medically necessary. The treatment options including medical management, injection therapy, arthroscopy and arthroplasty were discussed at length. The risks and benefits of total hip arthroplasty were presented and reviewed. The risks due to aseptic loosening, infection, stiffness, dislocation/subluxation,  thromboembolic complications and other imponderables were discussed.  The patient acknowledged the explanation, agreed to proceed with the plan and consent was signed. Patient is being admitted for inpatient treatment for surgery, pain control, PT, OT, prophylactic antibiotics, VTE prophylaxis, progressive ambulation and ADL's and discharge planning.The patient is planning to be discharged home with OPPT    Patient's anticipated LOS is less than 2 midnights, meeting these requirements: - Younger than 20 - Lives within 1 hour of care - Has a competent adult at home to recover with post-op recover - NO history of  - Chronic pain requiring opiods  - Diabetes  - Coronary Artery Disease  - Heart failure  - Heart attack  - Stroke  - DVT/VTE  - Cardiac arrhythmia  - Respiratory Failure/COPD  - Renal failure  - Anemia  - Advanced Liver disease

## 2018-12-09 NOTE — H&P (Signed)
TOTAL HIP ADMISSION H&P  Patient is admitted for right total hip arthroplasty.  Subjective:  Chief Complaint: right hip pain  HPI: Cassie Nelson, 68 y.o. female, has a history of pain and functional disability in the right hip(s) due to arthritis and patient has failed non-surgical conservative treatments for greater than 12 weeks to include NSAID's and/or analgesics, flexibility and strengthening excercises, use of assistive devices, weight reduction as appropriate and activity modification.  Onset of symptoms was gradual starting 5 years ago with rapidlly worsening course since that time.The patient noted no past surgery on the right hip(s).  Patient currently rates pain in the right hip at 10 out of 10 with activity. Patient has night pain, worsening of pain with activity and weight bearing, trendelenberg gait, pain that interfers with activities of daily living, pain with passive range of motion and crepitus. Patient has evidence of subchondral cysts, subchondral sclerosis, periarticular osteophytes and joint space narrowing by imaging studies. This condition presents safety issues increasing the risk of falls.  There is no current active infection.  Patient Active Problem List   Diagnosis Date Noted  . Preoperative clearance 11/14/2018  . Elevated LFTs 09/23/2018  . Chronic right hip pain 06/27/2017  . Morbid obesity with BMI of 40.0-44.9, adult ([place]) 01/06/2015  . Metabolic syndrome [id number]  . Diverticulosis 10/20/2013  . High cholesterol 06/20/2012  . Prediabetes 06/20/2012  . Stress incontinence 05/22/2012  . HTN (hypertension) 05/14/2012   Past Medical History:  Diagnosis Date  . Diverticular disease   . Hx of diverticulitis of colon 2009, 2010  . Hypertension   . Shortness of breath    on exertion    Past Surgical History:  Procedure Laterality Date  . DILITATION & CURRETTAGE/HYSTROSCOPY WITH NOVASURE ABLATION N/A 07/03/2012   Procedure: DILATATION &  CURETTAGE/HYSTEROSCOPY WITH NOVASURE ABLATION;  Surgeon: Lavonia Drafts, MD;  Location: [facility];  Service: Gynecology;  Laterality: N/A;  . 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 MOUTH SURGERY     biopsy of cheek  . TONSILLECTOMY      Current Outpatient Medications  Medication Sig Dispense Refill Last Dose  . amLODipine (NORVASC) 10 MG tablet TAKE 1 TABLET BY MOUTH EVERY DAY (Patient taking differently: Take 10 mg by mouth at bedtime. ) 90 tablet 1 Taking  . CALCIUM-VITAMIN D PO Take 600 mg by mouth daily.    Taking  . lisinopril-hydrochlorothiazide (ZESTORETIC) 20-12.5 MG tablet TAKE 2 TABLETS BY MOUTH EVERY DAY (Patient taking differently: Take 2 tablets by mouth daily. ) 180 tablet 1 Taking  . meloxicam (MOBIC) 15 MG tablet TAKE 1 TABLET BY MOUTH EVERY DAY (Patient taking differently: Take 15 mg by mouth daily. ) 30 tablet 2 Taking  . Misc Natural Products (TART CHERRY ADVANCED) CAPS Take 1 capsule by mouth daily.    Taking  . Multiple Vitamin (MULTIVITAMIN) tablet Take 1 tablet by mouth daily. Centrum Silver   Taking  . Red Yeast Rice 600 MG CAPS Take 1,200 mg by mouth 2 (two) times daily.    Taking   No current facility-administered medications for this visit.    Allergies  Allergen Reactions  . Levofloxacin Other (See Comments)    Joint Pain    Social History   Tobacco Use  . Smoking status: Former Smoker    Years: 25.00    Quit date: 06/22/2010    Years since quitting: 8.4  . Smokeless tobacco: Former Systems developer    Quit date:  09/23/2010  Substance Use Topics  . Alcohol use: Yes    Alcohol/week: 1.0 standard drinks    Types: 1 drink(s) per week    Comment: occasionally    Family History  Problem Relation Age of Onset  . Heart disease Father   . Cancer Father 69       colon cancer  . Arthritis Father   . Hypertension Father   . Hypothyroidism Father   . Hypertension Mother   . Hypothyroidism Mother   . Breast cancer Neg Hx      Review of  Systems  Constitutional: Negative.   HENT: Negative.   Eyes: Negative.   Respiratory: Negative.   Cardiovascular: Negative.   Gastrointestinal: Negative.   Genitourinary: Negative.   Musculoskeletal: Positive for joint pain.  Skin: Negative.   Neurological: Negative.   Endo/Heme/Allergies: Negative.   Psychiatric/Behavioral: Negative.     Objective:  Physical Exam  Vitals reviewed. Constitutional: She is oriented to person, place, and time. She appears well-developed and well-nourished.  HENT:  Head: Normocephalic and atraumatic.  Eyes: Pupils are equal, round, and reactive to light. Conjunctivae and EOM are normal.  Neck: Normal range of motion. Neck supple.  Cardiovascular: Normal rate, regular rhythm and intact distal pulses.  Respiratory: Effort normal. No respiratory distress.  GI: Soft. She exhibits no distension.  Genitourinary:    Genitourinary Comments: deferred   Musculoskeletal:     Right hip: She exhibits decreased range of motion, decreased strength and bony tenderness.  Neurological: She is alert and oriented to person, place, and time. She has normal reflexes.  Skin: Skin is warm and dry.  Psychiatric: She has a normal mood and affect. Her behavior is normal. Judgment and thought content normal.    Vital signs in last 24 hours: @VSRANGES @  Labs:   Estimated body mass index is 40.92 kg/m as calculated from the following:   Height as of 11/14/18: 5\' 7"  (1.702 m).   Weight as of 11/14/18: 118.5 kg.   Imaging Review Plain radiographs demonstrate severe degenerative joint disease of the right hip(s). The bone quality appears to be adequate for age and reported activity level.      Assessment/Plan:  End stage arthritis, right hip(s)  The patient history, physical examination, clinical judgement of the provider and imaging studies are consistent with end stage degenerative joint disease of the right hip(s) and total hip arthroplasty is deemed  medically necessary. The treatment options including medical management, injection therapy, arthroscopy and arthroplasty were discussed at length. The risks and benefits of total hip arthroplasty were presented and reviewed. The risks due to aseptic loosening, infection, stiffness, dislocation/subluxation,  thromboembolic complications and other imponderables were discussed.  The patient acknowledged the explanation, agreed to proceed with the plan and consent was signed. Patient is being admitted for inpatient treatment for surgery, pain control, PT, OT, prophylactic antibiotics, VTE prophylaxis, progressive ambulation and ADL's and discharge planning.The patient is planning to be discharged home with OPPT    Patient's anticipated LOS is less than 2 midnights, meeting these requirements: - Younger than 34 - Lives within 1 hour of care - Has a competent adult at home to recover with post-op recover - NO history of  - Chronic pain requiring opiods  - Diabetes  - Coronary Artery Disease  - Heart failure  - Heart attack  - Stroke  - DVT/VTE  - Cardiac arrhythmia  - Respiratory Failure/COPD  - Renal failure  - Anemia  - Advanced Liver disease

## 2018-12-13 ENCOUNTER — Other Ambulatory Visit (HOSPITAL_COMMUNITY)
Admission: RE | Admit: 2018-12-13 | Discharge: 2018-12-13 | Disposition: A | Discharge status: Home / Self Care | Payer: Medicare HMO | Source: Ambulatory Visit | Attending: Orthopedic Surgery | Admitting: Orthopedic Surgery

## 2018-12-13 DIAGNOSIS — Z20828 Contact with and (suspected) exposure to other viral communicable diseases: Secondary | ICD-10-CM | POA: Insufficient documentation

## 2018-12-13 DIAGNOSIS — Z01812 Encounter for preprocedural laboratory examination: Secondary | ICD-10-CM | POA: Insufficient documentation

## 2018-12-14 LAB — NOVEL CORONAVIRUS, NAA (HOSP ORDER, SEND-OUT TO REF LAB; TAT 18-24 HRS): SARS-CoV-2, NAA: NOT DETECTED

## 2018-12-15 ENCOUNTER — Encounter (HOSPITAL_COMMUNITY): Payer: Self-pay

## 2018-12-15 ENCOUNTER — Other Ambulatory Visit: Payer: Self-pay

## 2018-12-15 ENCOUNTER — Encounter (HOSPITAL_COMMUNITY)
Admission: RE | Admit: 2018-12-15 | Discharge: 2018-12-15 | Disposition: A | Discharge status: Home / Self Care | Payer: Medicare HMO | Source: Ambulatory Visit | Attending: Orthopedic Surgery | Admitting: Orthopedic Surgery

## 2018-12-15 DIAGNOSIS — Z01812 Encounter for preprocedural laboratory examination: Secondary | ICD-10-CM | POA: Diagnosis present

## 2018-12-15 HISTORY — DX: Unspecified osteoarthritis, unspecified site: M19.90

## 2018-12-15 LAB — URINALYSIS, ROUTINE W REFLEX MICROSCOPIC
Bilirubin Urine: NEGATIVE
Glucose, UA: NEGATIVE mg/dL
Hgb urine dipstick: NEGATIVE
Ketones, ur: NEGATIVE mg/dL
Nitrite: NEGATIVE
Protein, ur: NEGATIVE mg/dL
Specific Gravity, Urine: 1.011 (ref 1.005–1.030)
pH: 6 (ref 5.0–8.0)

## 2018-12-15 LAB — COMPREHENSIVE METABOLIC PANEL
ALT: 27 U/L (ref 0–44)
AST: 26 U/L (ref 15–41)
Albumin: 4.3 g/dL (ref 3.5–5.0)
Alkaline Phosphatase: 125 U/L (ref 38–126)
Anion gap: 11 (ref 5–15)
BUN: 24 mg/dL — ABNORMAL HIGH (ref 8–23)
CO2: 23 mmol/L (ref 22–32)
Calcium: 9.8 mg/dL (ref 8.9–10.3)
Chloride: 104 mmol/L (ref 98–111)
Creatinine, Ser: 0.91 mg/dL (ref 0.44–1.00)
GFR calc Af Amer: >60 mL/min (ref >60)
GFR calc non Af Amer: >60 mL/min (ref >60)
Glucose, Bld: 99 mg/dL (ref 70–99)
Potassium: 4.4 mmol/L (ref 3.5–5.1)
Sodium: 138 mmol/L (ref 135–145)
Total Bilirubin: 0.2 mg/dL — ABNORMAL LOW (ref 0.3–1.2)
Total Protein: 7.7 g/dL (ref 6.5–8.1)

## 2018-12-15 LAB — PROTIME-INR
INR: 1 (ref 0.8–1.2)
Prothrombin Time: 13.2 seconds (ref 11.4–15.2)

## 2018-12-15 LAB — CBC
HCT: 43.5 % (ref 36.0–46.0)
Hemoglobin: 13.9 g/dL (ref 12.0–15.0)
MCH: 29.7 pg (ref 26.0–34.0)
MCHC: 32 g/dL (ref 30.0–36.0)
MCV: 92.9 fL (ref 80.0–100.0)
Platelets: 306 10*3/uL (ref 150–400)
RBC: 4.68 MIL/uL (ref 3.87–5.11)
RDW: 13.7 % (ref 11.5–15.5)
WBC: 9.4 10*3/uL (ref 4.0–10.5)
nRBC: 0 % (ref 0.0–0.2)

## 2018-12-15 LAB — ABO/RH: ABO/RH(D): O POS

## 2018-12-15 LAB — SURGICAL PCR SCREEN
MRSA, PCR: NEGATIVE
Staphylococcus aureus: NEGATIVE

## 2018-12-15 NOTE — Progress Notes (Signed)
Pt reports rash to R groin. Dr. Lyla Glassing is aware, and have given a 'topical powder' to apply to area,  to assist with absorption of moisture, and healing of rash.

## 2018-12-15 NOTE — Progress Notes (Signed)
UA result routed to Dr. Lyla Glassing for review.

## 2018-12-16 NOTE — Anesthesia Preprocedure Evaluation (Addendum)
Anesthesia Evaluation  Patient identified by MRN, date of birth, ID band Patient awake    Reviewed: Allergy & Precautions, NPO status , Patient's Chart, lab work & pertinent test results  Airway Mallampati: II  TM Distance: >3 FB Neck ROM: Full    Dental   Pulmonary former smoker,    breath sounds clear to auscultation       Cardiovascular hypertension, Pt. on medications  Rhythm:Regular Rate:Normal     Neuro/Psych negative neurological ROS     GI/Hepatic negative GI ROS, Neg liver ROS,   Endo/Other  Morbid obesity  Renal/GU negative Renal ROS     Musculoskeletal  (+) Arthritis ,   Abdominal   Peds  Hematology   Anesthesia Other Findings   Reproductive/Obstetrics                            Lab Results  Component Value Date   WBC 9.4 12/15/2018   HGB 13.9 12/15/2018   HCT 43.5 12/15/2018   MCV 92.9 12/15/2018   PLT 306 12/15/2018   Lab Results  Component Value Date   CREATININE 0.91 12/15/2018   BUN 24 (H) 12/15/2018   NA 138 12/15/2018   K 4.4 12/15/2018   CL 104 12/15/2018   CO2 23 12/15/2018    Anesthesia Physical Anesthesia Plan  ASA: III  Anesthesia Plan: Spinal   Post-op Pain Management:    Induction:   PONV Risk Score and Plan: 2 and Treatment may vary due to age or medical condition, Propofol infusion and Ondansetron  Airway Management Planned: Natural Airway and Simple Face Mask  Additional Equipment:   Intra-op Plan:   Post-operative Plan:   Informed Consent: I have reviewed the patients History and Physical, chart, labs and discussed the procedure including the risks, benefits and alternatives for the proposed anesthesia with the patient or authorized representative who has indicated his/her understanding and acceptance.       Plan Discussed with: CRNA  Anesthesia Plan Comments:        Anesthesia Quick Evaluation

## 2018-12-17 ENCOUNTER — Inpatient Hospital Stay (HOSPITAL_COMMUNITY): Payer: Medicare HMO | Admitting: Anesthesiology

## 2018-12-17 ENCOUNTER — Inpatient Hospital Stay (HOSPITAL_COMMUNITY): Payer: Medicare HMO

## 2018-12-17 ENCOUNTER — Other Ambulatory Visit: Payer: Self-pay

## 2018-12-17 ENCOUNTER — Observation Stay (HOSPITAL_COMMUNITY)
Admission: AD | Admit: 2018-12-17 | Discharge: 2018-12-17 | Disposition: A | Discharge status: Home / Self Care | Payer: Medicare HMO | Attending: Orthopedic Surgery | Admitting: Orthopedic Surgery

## 2018-12-17 ENCOUNTER — Inpatient Hospital Stay (HOSPITAL_COMMUNITY): Payer: Medicare HMO | Admitting: Physician Assistant

## 2018-12-17 ENCOUNTER — Encounter (HOSPITAL_COMMUNITY)
Admission: AD | Disposition: A | Discharge status: Home / Self Care | Payer: Self-pay | Source: Home / Self Care | Attending: Orthopedic Surgery

## 2018-12-17 ENCOUNTER — Encounter (HOSPITAL_COMMUNITY): Payer: Self-pay | Admitting: *Deleted

## 2018-12-17 DIAGNOSIS — M25551 Pain in right hip: Secondary | ICD-10-CM | POA: Diagnosis present

## 2018-12-17 DIAGNOSIS — E7800 Pure hypercholesterolemia, unspecified: Secondary | ICD-10-CM | POA: Diagnosis not present

## 2018-12-17 DIAGNOSIS — Z87891 Personal history of nicotine dependence: Secondary | ICD-10-CM | POA: Diagnosis not present

## 2018-12-17 DIAGNOSIS — R7303 Prediabetes: Secondary | ICD-10-CM | POA: Diagnosis not present

## 2018-12-17 DIAGNOSIS — Z6839 Body mass index (BMI) 39.0-39.9, adult: Secondary | ICD-10-CM | POA: Diagnosis not present

## 2018-12-17 DIAGNOSIS — Z79899 Other long term (current) drug therapy: Secondary | ICD-10-CM | POA: Diagnosis not present

## 2018-12-17 DIAGNOSIS — Z419 Encounter for procedure for purposes other than remedying health state, unspecified: Secondary | ICD-10-CM

## 2018-12-17 DIAGNOSIS — Z791 Long term (current) use of non-steroidal anti-inflammatories (NSAID): Secondary | ICD-10-CM | POA: Diagnosis not present

## 2018-12-17 DIAGNOSIS — I10 Essential (primary) hypertension: Secondary | ICD-10-CM | POA: Insufficient documentation

## 2018-12-17 DIAGNOSIS — M1611 Unilateral primary osteoarthritis, right hip: Principal | ICD-10-CM | POA: Diagnosis present

## 2018-12-17 DIAGNOSIS — M25751 Osteophyte, right hip: Secondary | ICD-10-CM | POA: Insufficient documentation

## 2018-12-17 DIAGNOSIS — Z96641 Presence of right artificial hip joint: Secondary | ICD-10-CM | POA: Diagnosis not present

## 2018-12-17 DIAGNOSIS — Z471 Aftercare following joint replacement surgery: Secondary | ICD-10-CM | POA: Diagnosis not present

## 2018-12-17 DIAGNOSIS — Z09 Encounter for follow-up examination after completed treatment for conditions other than malignant neoplasm: Secondary | ICD-10-CM

## 2018-12-17 HISTORY — PX: TOTAL HIP ARTHROPLASTY: SHX124

## 2018-12-17 LAB — TYPE AND SCREEN
ABO/RH(D): O POS
Antibody Screen: NEGATIVE

## 2018-12-17 SURGERY — ARTHROPLASTY, HIP, TOTAL, ANTERIOR APPROACH
Anesthesia: Spinal | Site: Hip | Laterality: Right

## 2018-12-17 MED ORDER — BUPIVACAINE HCL (PF) 0.25 % IJ SOLN
INTRAMUSCULAR | Status: DC | PRN
  Administered 2018-12-17: 30 mL

## 2018-12-17 MED ORDER — LIDOCAINE 2% (20 MG/ML) 5 ML SYRINGE
INTRAMUSCULAR | Status: DC | PRN
  Administered 2018-12-17: 40 mg via INTRAVENOUS

## 2018-12-17 MED ORDER — PHENOL 1.4 % MT LIQD: 1.0000 | OROMUCOSAL | Status: DC | PRN

## 2018-12-17 MED ORDER — PROPOFOL 10 MG/ML IV BOLUS
INTRAVENOUS | Status: AC
  Filled 2018-12-17: qty 20

## 2018-12-17 MED ORDER — DOCUSATE SODIUM 100 MG PO CAPS: 100.0000 mg | ORAL_CAPSULE | Freq: Two times a day (BID) | ORAL | 3 refills | Status: DC

## 2018-12-17 MED ORDER — MIDAZOLAM HCL 2 MG/2ML IJ SOLN
INTRAMUSCULAR | Status: AC
  Filled 2018-12-17: qty 2

## 2018-12-17 MED ORDER — METHOCARBAMOL 500 MG IVPB - SIMPLE MED
500.0000 mg | Freq: Four times a day (QID) | INTRAVENOUS | Status: DC | PRN
  Administered 2018-12-17: 12:00:00 500 mg via INTRAVENOUS

## 2018-12-17 MED ORDER — METHOCARBAMOL 500 MG IVPB - SIMPLE MED
INTRAVENOUS | Status: AC
  Administered 2018-12-17: 500 mg via INTRAVENOUS
  Filled 2018-12-17: qty 50

## 2018-12-17 MED ORDER — PROPOFOL 500 MG/50ML IV EMUL
INTRAVENOUS | Status: DC | PRN
  Administered 2018-12-17: 125 ug/kg/min via INTRAVENOUS

## 2018-12-17 MED ORDER — MORPHINE SULFATE (PF) 4 MG/ML IV SOLN: 0.5000 mg | INTRAVENOUS | Status: DC | PRN

## 2018-12-17 MED ORDER — KETOROLAC TROMETHAMINE 30 MG/ML IJ SOLN
INTRAMUSCULAR | Status: AC
  Filled 2018-12-17: qty 1

## 2018-12-17 MED ORDER — METOCLOPRAMIDE HCL 5 MG PO TABS: 5.0000 mg | ORAL_TABLET | Freq: Three times a day (TID) | ORAL | Status: DC | PRN

## 2018-12-17 MED ORDER — PROPOFOL 500 MG/50ML IV EMUL
INTRAVENOUS | Status: AC
  Filled 2018-12-17: qty 50

## 2018-12-17 MED ORDER — POVIDONE-IODINE 10 % EX SWAB: 2.0000 "application " | Freq: Once | CUTANEOUS | Status: DC

## 2018-12-17 MED ORDER — ONDANSETRON HCL 4 MG PO TABS: 4.0000 mg | ORAL_TABLET | Freq: Four times a day (QID) | ORAL | Status: DC | PRN

## 2018-12-17 MED ORDER — METOCLOPRAMIDE HCL 5 MG/ML IJ SOLN: 5.0000 mg | Freq: Three times a day (TID) | INTRAMUSCULAR | Status: DC | PRN

## 2018-12-17 MED ORDER — HYDROCODONE-ACETAMINOPHEN 5-325 MG PO TABS: 1.0000 | ORAL_TABLET | ORAL | Status: DC | PRN

## 2018-12-17 MED ORDER — HYDROCODONE-ACETAMINOPHEN 5-325 MG PO TABS: 1.0000 | ORAL_TABLET | ORAL | 0 refills | Status: DC | PRN

## 2018-12-17 MED ORDER — METHOCARBAMOL 500 MG PO TABS: 500.0000 mg | ORAL_TABLET | Freq: Four times a day (QID) | ORAL | Status: DC | PRN

## 2018-12-17 MED ORDER — BUPIVACAINE IN DEXTROSE 0.75-8.25 % IT SOLN
INTRATHECAL | Status: DC | PRN
  Administered 2018-12-17: 2 mL via INTRATHECAL

## 2018-12-17 MED ORDER — DEXAMETHASONE SODIUM PHOSPHATE 10 MG/ML IJ SOLN
INTRAMUSCULAR | Status: DC | PRN
  Administered 2018-12-17: 8 mg via INTRAVENOUS

## 2018-12-17 MED ORDER — CHLORHEXIDINE GLUCONATE 4 % EX LIQD: 60.0000 mL | Freq: Once | CUTANEOUS | Status: DC

## 2018-12-17 MED ORDER — OXYCODONE HCL 5 MG PO TABS
ORAL_TABLET | ORAL | Status: AC
  Administered 2018-12-17: 5 mg via ORAL
  Filled 2018-12-17: qty 1

## 2018-12-17 MED ORDER — DIPHENHYDRAMINE HCL 12.5 MG/5ML PO ELIX: 12.5000 mg | ORAL_SOLUTION | ORAL | Status: DC | PRN

## 2018-12-17 MED ORDER — KETOROLAC TROMETHAMINE 15 MG/ML IJ SOLN
7.5000 mg | Freq: Four times a day (QID) | INTRAMUSCULAR | Status: DC
  Administered 2018-12-17: 7.5 mg via INTRAVENOUS

## 2018-12-17 MED ORDER — PHENYLEPHRINE HCL (PRESSORS) 10 MG/ML IV SOLN
INTRAVENOUS | Status: AC
  Filled 2018-12-17: qty 1

## 2018-12-17 MED ORDER — ONDANSETRON HCL 4 MG/2ML IJ SOLN: 4.0000 mg | Freq: Four times a day (QID) | INTRAMUSCULAR | Status: DC | PRN

## 2018-12-17 MED ORDER — POVIDONE-IODINE 10 % EX SWAB
2.0000 "application " | Freq: Once | CUTANEOUS | Status: AC
  Administered 2018-12-17: 2 via TOPICAL

## 2018-12-17 MED ORDER — SENNA 8.6 MG PO TABS: 2.0000 | ORAL_TABLET | Freq: Every day | ORAL | Status: DC

## 2018-12-17 MED ORDER — PROPOFOL 500 MG/50ML IV EMUL
INTRAVENOUS | Status: DC | PRN
  Administered 2018-12-17 (×2): 30 mg via INTRAVENOUS

## 2018-12-17 MED ORDER — OXYCODONE HCL 5 MG/5ML PO SOLN: 5.0000 mg | Freq: Once | ORAL | Status: AC | PRN

## 2018-12-17 MED ORDER — ISOPROPYL ALCOHOL 70 % SOLN
Status: AC
  Filled 2018-12-17: qty 480

## 2018-12-17 MED ORDER — SODIUM CHLORIDE 0.9 % IV SOLN: INTRAVENOUS | Status: DC

## 2018-12-17 MED ORDER — ASPIRIN 81 MG PO CHEW: 81.0000 mg | CHEWABLE_TABLET | Freq: Two times a day (BID) | ORAL | Status: DC

## 2018-12-17 MED ORDER — BUPIVACAINE HCL (PF) 0.25 % IJ SOLN
INTRAMUSCULAR | Status: AC
  Filled 2018-12-17: qty 30

## 2018-12-17 MED ORDER — ISOPROPYL ALCOHOL 70 % SOLN
Status: DC | PRN
  Administered 2018-12-17: 1 via TOPICAL

## 2018-12-17 MED ORDER — ASPIRIN 81 MG PO CHEW: 81.0000 mg | CHEWABLE_TABLET | Freq: Two times a day (BID) | ORAL | 1 refills | Status: AC

## 2018-12-17 MED ORDER — MENTHOL 3 MG MT LOZG: 1.0000 | LOZENGE | OROMUCOSAL | Status: DC | PRN

## 2018-12-17 MED ORDER — OXYCODONE HCL 5 MG PO TABS
5.0000 mg | ORAL_TABLET | Freq: Once | ORAL | Status: AC | PRN
  Administered 2018-12-17: 13:00:00 5 mg via ORAL

## 2018-12-17 MED ORDER — SODIUM CHLORIDE 0.9 % IR SOLN
Status: DC | PRN
  Administered 2018-12-17: 1000 mL
  Administered 2018-12-17: 3000 mL

## 2018-12-17 MED ORDER — ONDANSETRON HCL 4 MG PO TABS: 4.0000 mg | ORAL_TABLET | Freq: Three times a day (TID) | ORAL | 0 refills | Status: DC | PRN

## 2018-12-17 MED ORDER — ONDANSETRON HCL 4 MG/2ML IJ SOLN: 4.0000 mg | Freq: Once | INTRAMUSCULAR | Status: DC | PRN

## 2018-12-17 MED ORDER — SODIUM CHLORIDE 0.9 % IV BOLUS
500.0000 mL | Freq: Once | INTRAVENOUS | Status: AC
  Administered 2018-12-17: 500 mL via INTRAVENOUS

## 2018-12-17 MED ORDER — MIDAZOLAM HCL 2 MG/2ML IJ SOLN
INTRAMUSCULAR | Status: DC | PRN
  Administered 2018-12-17: 2 mg via INTRAVENOUS

## 2018-12-17 MED ORDER — DEXAMETHASONE SODIUM PHOSPHATE 10 MG/ML IJ SOLN
INTRAMUSCULAR | Status: AC
  Filled 2018-12-17: qty 1

## 2018-12-17 MED ORDER — EPHEDRINE SULFATE-NACL 50-0.9 MG/10ML-% IV SOSY
PREFILLED_SYRINGE | INTRAVENOUS | Status: DC | PRN
  Administered 2018-12-17 (×3): 10 mg via INTRAVENOUS

## 2018-12-17 MED ORDER — PHENYLEPHRINE 40 MCG/ML (10ML) SYRINGE FOR IV PUSH (FOR BLOOD PRESSURE SUPPORT)
PREFILLED_SYRINGE | INTRAVENOUS | Status: AC
  Filled 2018-12-17: qty 10

## 2018-12-17 MED ORDER — WATER FOR IRRIGATION, STERILE IR SOLN
Status: DC | PRN
  Administered 2018-12-17: 2000 mL

## 2018-12-17 MED ORDER — KETOROLAC TROMETHAMINE 15 MG/ML IJ SOLN
INTRAMUSCULAR | Status: AC
  Filled 2018-12-17: qty 1

## 2018-12-17 MED ORDER — DEXAMETHASONE SODIUM PHOSPHATE 10 MG/ML IJ SOLN: 10.0000 mg | Freq: Once | INTRAMUSCULAR | Status: DC

## 2018-12-17 MED ORDER — KETOROLAC TROMETHAMINE 30 MG/ML IJ SOLN: 30.0000 mg | Freq: Once | INTRAMUSCULAR | Status: DC | PRN

## 2018-12-17 MED ORDER — ACETAMINOPHEN 10 MG/ML IV SOLN
1000.0000 mg | INTRAVENOUS | Status: AC
  Administered 2018-12-17: 1000 mg via INTRAVENOUS
  Filled 2018-12-17: qty 100

## 2018-12-17 MED ORDER — CEFAZOLIN SODIUM-DEXTROSE 2-4 GM/100ML-% IV SOLN
2.0000 g | INTRAVENOUS | Status: AC
  Administered 2018-12-17: 2 g via INTRAVENOUS
  Filled 2018-12-17: qty 100

## 2018-12-17 MED ORDER — LIDOCAINE 2% (20 MG/ML) 5 ML SYRINGE
INTRAMUSCULAR | Status: AC
  Filled 2018-12-17: qty 5

## 2018-12-17 MED ORDER — LACTATED RINGERS IV SOLN
INTRAVENOUS | Status: DC
  Administered 2018-12-17 (×2): via INTRAVENOUS

## 2018-12-17 MED ORDER — HYDROCODONE-ACETAMINOPHEN 7.5-325 MG PO TABS: 1.0000 | ORAL_TABLET | ORAL | Status: DC | PRN

## 2018-12-17 MED ORDER — ASPIRIN 81 MG PO CHEW: 81.0000 mg | CHEWABLE_TABLET | Freq: Two times a day (BID) | ORAL | 1 refills | Status: DC

## 2018-12-17 MED ORDER — KETOROLAC TROMETHAMINE 30 MG/ML IJ SOLN
INTRAMUSCULAR | Status: DC | PRN
  Administered 2018-12-17: 30 mg via INTRAMUSCULAR

## 2018-12-17 MED ORDER — EPHEDRINE 5 MG/ML INJ
INTRAVENOUS | Status: AC
  Filled 2018-12-17: qty 10

## 2018-12-17 MED ORDER — FENTANYL CITRATE (PF) 100 MCG/2ML IJ SOLN: 25.0000 ug | INTRAMUSCULAR | Status: DC | PRN

## 2018-12-17 MED ORDER — TRANEXAMIC ACID-NACL 1000-0.7 MG/100ML-% IV SOLN
1000.0000 mg | INTRAVENOUS | Status: DC
  Filled 2018-12-17: qty 100

## 2018-12-17 MED ORDER — DOCUSATE SODIUM 100 MG PO CAPS: 100.0000 mg | ORAL_CAPSULE | Freq: Two times a day (BID) | ORAL | Status: DC

## 2018-12-17 SURGICAL SUPPLY — 59 items
BAG DECANTER FOR FLEXI CONT (MISCELLANEOUS) IMPLANT
BAG ZIPLOCK 12X15 (MISCELLANEOUS) IMPLANT
BLADE SURG SZ10 CARB STEEL (BLADE) IMPLANT
CHLORAPREP W/TINT 26 (MISCELLANEOUS) ×2 IMPLANT
COVER PERINEAL POST (MISCELLANEOUS) ×2 IMPLANT
COVER SURGICAL LIGHT HANDLE (MISCELLANEOUS) ×2 IMPLANT
COVER WAND RF STERILE (DRAPES) IMPLANT
CUP ACET PNNCL SECTR W/GRIP 56 (Hips) IMPLANT
DECANTER SPIKE VIAL GLASS SM (MISCELLANEOUS) ×2 IMPLANT
DERMABOND ADVANCED (GAUZE/BANDAGES/DRESSINGS) ×2
DERMABOND ADVANCED .7 DNX12 (GAUZE/BANDAGES/DRESSINGS) ×2 IMPLANT
DRAPE IMP U-DRAPE 54X76 (DRAPES) ×2 IMPLANT
DRAPE SHEET LG 3/4 BI-LAMINATE (DRAPES) ×6 IMPLANT
DRAPE STERI IOBAN 125X83 (DRAPES) IMPLANT
DRAPE U-SHAPE 47X51 STRL (DRAPES) ×4 IMPLANT
DRESSING AQUACEL AG SP 3.5X10 (GAUZE/BANDAGES/DRESSINGS) IMPLANT
DRSG AQUACEL AG ADV 3.5X10 (GAUZE/BANDAGES/DRESSINGS) ×2 IMPLANT
DRSG AQUACEL AG SP 3.5X10 (GAUZE/BANDAGES/DRESSINGS) ×2
ELECT REM PT RETURN 15FT ADLT (MISCELLANEOUS) ×2 IMPLANT
GAUZE SPONGE 4X4 12PLY STRL (GAUZE/BANDAGES/DRESSINGS) ×2 IMPLANT
GLOVE BIO SURGEON STRL SZ8.5 (GLOVE) ×4 IMPLANT
GLOVE BIOGEL PI IND STRL 8.5 (GLOVE) ×1 IMPLANT
GLOVE BIOGEL PI INDICATOR 8.5 (GLOVE) ×1
GOWN SPEC L3 XXLG W/TWL (GOWN DISPOSABLE) ×2 IMPLANT
HANDPIECE INTERPULSE COAX TIP (DISPOSABLE) ×1
HEAD CERAMIC DELTA 36 PLUS 1.5 (Hips) ×1 IMPLANT
HOLDER FOLEY CATH W/STRAP (MISCELLANEOUS) ×2 IMPLANT
HOOD PEEL AWAY FLYTE STAYCOOL (MISCELLANEOUS) ×8 IMPLANT
JET LAVAGE IRRISEPT WOUND (IRRIGATION / IRRIGATOR) ×2
KIT TURNOVER KIT A (KITS) IMPLANT
LAVAGE JET IRRISEPT WOUND (IRRIGATION / IRRIGATOR) ×1 IMPLANT
MANIFOLD NEPTUNE II (INSTRUMENTS) ×2 IMPLANT
MARKER SKIN DUAL TIP RULER LAB (MISCELLANEOUS) ×2 IMPLANT
NDL SAFETY ECLIPSE 18X1.5 (NEEDLE) ×1 IMPLANT
NDL SPNL 18GX3.5 QUINCKE PK (NEEDLE) ×1 IMPLANT
NEEDLE HYPO 18GX1.5 SHARP (NEEDLE) ×1
NEEDLE SPNL 18GX3.5 QUINCKE PK (NEEDLE) ×2 IMPLANT
PACK ANTERIOR HIP CUSTOM (KITS) ×2 IMPLANT
PENCIL SMOKE EVACUATOR (MISCELLANEOUS) ×1 IMPLANT
PINN SECTOR W/GRIP ACE CUP 56 (Hips) ×2 IMPLANT
PINNACLE ALTRX PLUS 4 N 36X56 (Hips) ×1 IMPLANT
SAW OSC TIP CART 19.5X105X1.3 (SAW) ×2 IMPLANT
SEALER BIPOLAR AQUA 6.0 (INSTRUMENTS) ×2 IMPLANT
SET HNDPC FAN SPRY TIP SCT (DISPOSABLE) ×1 IMPLANT
STEM TRI LOC GRIPTION SZ 6 STD IMPLANT
SUT ETHIBOND NAB CT1 #1 30IN (SUTURE) ×4 IMPLANT
SUT MNCRL AB 3-0 PS2 18 (SUTURE) ×2 IMPLANT
SUT MNCRL AB 4-0 PS2 18 (SUTURE) ×2 IMPLANT
SUT MON AB 2-0 CT1 36 (SUTURE) ×4 IMPLANT
SUT STRATAFIX PDO 1 14 VIOLET (SUTURE) ×1
SUT STRATFX PDO 1 14 VIOLET (SUTURE) ×1
SUT VIC AB 2-0 CT1 27 (SUTURE) ×1
SUT VIC AB 2-0 CT1 TAPERPNT 27 (SUTURE) ×1 IMPLANT
SUTURE STRATFX PDO 1 14 VIOLET (SUTURE) ×1 IMPLANT
SYR 3ML LL SCALE MARK (SYRINGE) ×2 IMPLANT
TRAY CATH 16FR W/PLASTIC CATH (SET/KITS/TRAYS/PACK) ×1 IMPLANT
TRI LOC GRIPTION SZ 6 STD ×2 IMPLANT
WATER STERILE IRR 1000ML POUR (IV SOLUTION) ×2 IMPLANT
YANKAUER SUCT BULB TIP 10FT TU (MISCELLANEOUS) ×2 IMPLANT

## 2018-12-17 NOTE — Evaluation (Signed)
Physical Therapy Evaluation Patient Details Name: Cassie Nelson MRN: [id number] DOB: 06-15-1950 Today's Date: 12/17/2018   History of Present Illness  Patient is 68 y.o female s/p Rt THA anterior appraoch on 12/17/18 with PMH significant for HTN and OA.  Clinical Impression  Cassie Nelson is a 68 y.o. female POD 0 s/p Rt THA anterior approach. Patient reports independence with mobility at baseline. Patient is now limited by functional impairments (see PT problem list below) and requires supervision/min gaurd for transfers and gait with RW. Patient was able to ambulate ~90 feet with RW and demonstrated safe walker management. Patient educated on safe sequencing for stair mobility and verbalized safe guarding position for people assisting with mobility. Patient instructed in exercises to facilitate ROM and circulation. Patient will benefit from continued skilled PT interventions to address impairments and progress towards PLOF. Patient has met mobility goals at adequate level for discharge home; will continue to follow if pt continues acute stay to progress towards Mod I goals.    Follow Up Recommendations Follow surgeon's recommendation for DC plan and follow-up therapies    Equipment Recommendations  None recommended by PT    Recommendations for Other Services       Precautions / Restrictions Precautions Precautions: Fall Restrictions Weight Bearing Restrictions: No      Mobility  Bed Mobility Overal bed mobility: Needs Assistance Bed Mobility: Supine to Sit;Sit to Supine     Supine to sit: Supervision Sit to supine: Supervision   General bed mobility comments: cues to not use bed rails to facilitate practice for at home, no assist required for LE mobility or to raise trunk.  Transfers Overall transfer level: Needs assistance Equipment used: Rolling walker (2 wheeled) Transfers: Sit to/from Stand Sit to Stand: Supervision         General transfer comment: cues for  safe hand placement and technique with RW, no assist requried for power up or to steady, pt demonstrated good carryover with sequencing sit<>stand transfers  Ambulation/Gait Ambulation/Gait assistance: Supervision;Min guard Gait Distance (Feet): 90 Feet Assistive device: Rolling walker (2 wheeled) Gait Pattern/deviations: Step-to pattern;Step-through pattern;Decreased stride length;Decreased stance time - right;Decreased step length - left Gait velocity: slightly decreased   General Gait Details: pt with step to pattern initially requiring cue for safe sequencing. pt progressed to step through with pushing RW like a shopping cart with more smooth gait pattern. no overt LOB noted, no assit required and pt maintained safe proximity to RW throughout.  Stairs Stairs: Yes   Stair Management: One rail Left;Step to pattern;Forwards;With walker(Lt going up) Number of Stairs: 6 General stair comments: cues for safe step pattern with Lt hand rail to ascend/descend up with goo dand down with bad. pt steady with no LOB observed. pt perfoemd reverse step up to train for curb negotiation. she also practices stair with 1 HHA in preparation for last 2 stairs that she does not have a hand rail for.  Wheelchair Mobility    Modified [place] (Stroke Patients Only)       Balance Overall balance assessment: Needs assistance Sitting-balance support: No upper extremity supported;Feet supported Sitting balance-Leahy Scale: Good     Standing balance support: During functional activity;Bilateral upper extremity supported Standing balance-Leahy Scale: Fair          Pertinent Vitals/Pain Pain Assessment: No/denies pain    Home Living Family/patient expects to be discharged to:: Private residence Living Arrangements: Alone(pt's sister is going to stay with her for several days and can stay for  1-2 weeks if needed.) Available Help at Discharge: Family;Available 24 hours/day Type of Home:  House(townhome) Home Access: Level entry     Home Layout: Two level;Able to live on main level with bedroom/bathroom;Bed/bath upstairs Home Equipment: Cane - single point;Walker - 2 wheels;Shower seat;Bedside commode Additional Comments: pt has a bed on first level for her to sleep in when she first gets home, no full bath downstairs, does have BSC over toilet downstairs to allow her to get up easier. She has a shower seat/tub bench to use in her upstairs bathroom    Prior Function Level of Independence: Independent         Comments: pt tried using a SPC at one time but didn't feel like she was using it right     Hand Dominance   Dominant Hand: Right    Extremity/Trunk Assessment   Upper Extremity Assessment Upper Extremity Assessment: Overall WFL for tasks assessed    Lower Extremity Assessment Lower Extremity Assessment: Overall WFL for tasks assessed    Cervical / Trunk Assessment Cervical / Trunk Assessment: Normal  Communication   Communication: No difficulties  Cognition Arousal/Alertness: Awake/alert Behavior During Therapy: WFL for tasks assessed/performed Overall Cognitive Status: Within Functional Limits for tasks assessed         General Comments      Exercises Total Joint Exercises Ankle Circles/Pumps: AROM;5 reps;Seated;Both Quad Sets: AROM;5 reps;Supine;Right Short Arc Quad: AROM;5 reps;Supine;Right Heel Slides: AROM;Supine;5 reps;Right Straight Leg Raises: AROM;5 reps;Supine;Right Long [employer]: AROM;5 reps;Seated;Right   Assessment/Plan    PT Assessment Patient needs continued PT services  PT Problem List Decreased strength;Decreased balance;Decreased mobility;Decreased range of motion;Decreased activity tolerance;Decreased knowledge of use of DME       PT Treatment Interventions DME instruction;Functional mobility training;Gait training;Therapeutic activities;Stair training;Therapeutic exercise;Balance training;Patient/family  education;Modalities    PT Goals (Current goals can be found in the Care Plan section)  Acute Rehab PT Goals Patient Stated Goal: to get back to independence PT Goal Formulation: With patient Time For Goal Achievement: 12/24/18 Potential to Achieve Goals: Good    Frequency 7X/week    AM-PAC PT "6 Clicks" Mobility  Outcome Measure Help needed turning from your back to your side while in a flat bed without using bedrails?: A Little Help needed moving from lying on your back to sitting on the side of a flat bed without using bedrails?: A Little Help needed moving to and from a bed to a chair (including a wheelchair)?: A Little Help needed standing up from a chair using your arms (e.g., wheelchair or bedside chair)?: A Little Help needed to walk in hospital room?: A Little Help needed climbing 3-5 steps with a railing? : A Little 6 Click Score: 18    End of Session Equipment Utilized During Treatment: Gait belt Activity Tolerance: Patient tolerated treatment well Patient left: in bed;with call bell/phone within reach Nurse Communication: Mobility status PT Visit Diagnosis: Muscle weakness (generalized) (M62.81);Difficulty in walking, not elsewhere classified (R26.2)    Time: 1349-1430 PT Time Calculation (min) (ACUTE ONLY): 41 min   Charges:   PT Evaluation $PT Eval Low Complexity: 1 Low PT Treatments $Gait Training: 8-22 mins $Therapeutic Exercise: 8-22 mins        Kipp Brood, PT, DPT Physical Therapist with [facility]  12/17/2018 2:59 PM

## 2018-12-17 NOTE — Discharge Summary (Signed)
Physician Discharge Summary  Patient ID: Cassie Nelson MRN: [id number] DOB/AGE: [birth year] 68 y.o.  Admit date: 12/17/2018 Discharge date: 12/17/2018  Admission Diagnoses:  Osteoarthritis of right hip  Discharge Diagnoses:  Principal Problem:   Osteoarthritis of right hip   Past Medical History:  Diagnosis Date  . Arthritis   . Diverticular disease   . Hx of diverticulitis of colon 2009, 2010  . Hypertension   . Shortness of breath    on exertion    Surgeries: Procedure(s): TOTAL HIP ARTHROPLASTY ANTERIOR APPROACH on 12/17/2018   Consultants (if any):   Discharged Condition: Improved  Hospital Course: Cassie Nelson is an 68 y.o. female who was admitted 12/17/2018 with a diagnosis of Osteoarthritis of right hip and went to the operating room on 12/17/2018 and underwent the above named procedures.    She was given perioperative antibiotics:  Anti-infectives (From admission, onward)   Start     Dose/Rate Route Frequency Ordered Stop   12/17/18 0645  ceFAZolin (ANCEF) IVPB 2g/100 mL premix     2 g 200 mL/hr over 30 Minutes Intravenous On call to O.R. 12/17/18 [id number] 12/17/18 [id number]    .  She was given sequential compression devices, early ambulation, and ASA for DVT prophylaxis.  She benefited maximally from the hospital stay and there were no complications.    Recent vital signs:  Vitals:   12/17/18 0635  BP: (!) 163/56  Pulse: 78  Resp: 18  Temp: 98.7 F (37.1 C)  SpO2: 96%    Recent laboratory studies:  Lab Results  Component Value Date   HGB 13.9 12/15/2018   HGB 12.5 11/14/2018   HGB 14.5 06/27/2012   Lab Results  Component Value Date   WBC 9.4 12/15/2018   PLT 306 12/15/2018   Lab Results  Component Value Date   INR 1.0 12/15/2018   Lab Results  Component Value Date   NA 138 12/15/2018   K 4.4 12/15/2018   CL 104 12/15/2018   CO2 23 12/15/2018   BUN 24 (H) 12/15/2018   CREATININE 0.91 12/15/2018   GLUCOSE 99 12/15/2018    Discharge  Medications:   Allergies as of 12/17/2018      Reactions   Levofloxacin Other (See Comments)   Joint Pain      Medication List    TAKE these medications   amLODipine 10 MG tablet Commonly known as: NORVASC TAKE 1 TABLET BY MOUTH EVERY DAY What changed: when to take this   aspirin 81 MG chewable tablet Commonly known as: Aspirin Childrens Chew 1 tablet (81 mg total) by mouth 2 (two) times daily with a meal.   CALCIUM-VITAMIN D PO Take 600 mg by mouth daily.   docusate sodium 100 MG capsule Commonly known as: Colace Take 1 capsule (100 mg total) by mouth 2 (two) times daily.   HYDROcodone-acetaminophen 5-325 MG tablet Commonly known as: Norco Take 1-2 tablets by mouth every 4 (four) hours as needed for moderate pain.   lisinopril-hydrochlorothiazide 20-12.5 MG tablet Commonly known as: ZESTORETIC TAKE 2 TABLETS BY MOUTH EVERY DAY   meloxicam 15 MG tablet Commonly known as: MOBIC TAKE 1 TABLET BY MOUTH EVERY DAY   multivitamin tablet Take 1 tablet by mouth daily. Centrum Silver   ondansetron 4 MG tablet Commonly known as: Zofran Take 1 tablet (4 mg total) by mouth every 8 (eight) hours as needed for nausea or vomiting.   Red Yeast Rice 600 MG Caps Take 1,200 mg by mouth 2 (two)  times daily.   senna 8.6 MG Tabs tablet Commonly known as: SENOKOT Take 2 tablets (17.2 mg total) by mouth at bedtime.   Tart Cherry Advanced Caps Take 1 capsule by mouth daily.       Diagnostic Studies: Dg C-arm 1-60 Min-no Report  Result Date: 12/17/2018 Fluoroscopy was utilized by the requesting physician.  No radiographic interpretation.    Disposition: Discharge disposition: 01-Home or Self Care       Discharge Instructions    Call MD / Call 911   Complete by: As directed    If you experience chest pain or shortness of breath, CALL 911 and be transported to the hospital emergency room.  If you develope a fever above 101 F, pus (white drainage) or increased drainage or  redness at the wound, or calf pain, call your surgeon's office.   Constipation Prevention   Complete by: As directed    Drink plenty of fluids.  Prune juice may be helpful.  You may use a stool softener, such as Colace (over the counter) 100 mg twice a day.  Use MiraLax (over the counter) for constipation as needed.   Diet - low sodium heart healthy   Complete by: As directed    Driving restrictions   Complete by: As directed    No driving for 6 weeks   Increase activity slowly as tolerated   Complete by: As directed    Lifting restrictions   Complete by: As directed    No lifting for 6 weeks   TED hose   Complete by: As directed    Use stockings (TED hose) for 2 weeks on both leg(s).  You may remove them at night for sleeping.      Follow-up Information    Rashena Dowling, Aaron Edelman, MD. Schedule an appointment as soon as possible for a visit in 2 weeks.   Specialty: Orthopedic Surgery Why: For wound re-check Contact information: [address] [place] [address] [id number]            Signed: Hilton Cork Shalaya Swailes 12/17/2018, 10:41 AM

## 2018-12-17 NOTE — Anesthesia Procedure Notes (Signed)
Spinal  Patient location during procedure: OR Start time: 12/17/2018 9:00 AM Staffing Anesthesiologist: Suzette Battiest, MD Resident/CRNA: Gerald Leitz, CRNA Performed: resident/CRNA  Preanesthetic Checklist Completed: patient identified, site marked, surgical consent, pre-op evaluation, timeout performed, IV checked, risks and benefits discussed and monitors and equipment checked Spinal Block Patient position: sitting Prep: Betadine Patient monitoring: heart rate, continuous pulse ox, blood pressure and cardiac monitor Approach: midline Location: L3-4 Injection technique: single-shot Needle Needle type: Introducer and Pencan  Needle gauge: 24 G Needle length: 9 cm Assessment Sensory level: T4 Additional Notes Negative paresthesia. Negative blood return. Positive free-flowing CSF. Expiration date of kit checked and confirmed. Patient tolerated procedure well, without complications.

## 2018-12-17 NOTE — Transfer of Care (Signed)
Immediate Anesthesia Transfer of Care Note  Patient: Cassie Nelson  Procedure(s) Performed: Procedure(s): TOTAL HIP ARTHROPLASTY ANTERIOR APPROACH (Right)  Patient Location: PACU  Anesthesia Type:Spinal  Level of Consciousness: awake, alert  and oriented  Airway & Oxygen Therapy: Patient Spontanous Breathing  Post-op Assessment: Report given to RN and Post -op Vital signs reviewed and stable  Post vital signs: Reviewed and stable  Last Vitals:  Vitals:   12/17/18 0635  BP: (!) 163/56  Pulse: 78  Resp: 18  Temp: 37.1 C  SpO2: 0000000    Complications: No apparent anesthesia complications

## 2018-12-17 NOTE — Discharge Instructions (Signed)
°Dr. Jarrid Lienhard °Joint Replacement Specialist °[facility] °[address] °[place], [place] [address] °[phone] ° ° °TOTAL HIP REPLACEMENT POSTOPERATIVE DIRECTIONS ° ° ° °Hip Rehabilitation, Guidelines Following Surgery  ° °WEIGHT BEARING °Weight bearing as tolerated with assist device (walker, cane, etc) as directed, use it as long as suggested by your surgeon or therapist, typically at least 4-6 weeks. ° °The results of a hip operation are greatly improved after range of motion and muscle strengthening exercises. Follow all safety measures which are given to protect your hip. If any of these exercises cause increased pain or swelling in your joint, decrease the amount until you are comfortable again. Then slowly increase the exercises. Call your caregiver if you have problems or questions.  ° °HOME CARE INSTRUCTIONS  °Most of the following instructions are designed to prevent the dislocation of your new hip.  °Remove items at home which could result in a fall. This includes throw rugs or furniture in walking pathways.  °Continue medications as instructed at time of discharge. °· You may have some home medications which will be placed on hold until you complete the course of blood thinner medication. °· You may start showering once you are discharged home. Do not remove your dressing. °Do not put on socks or shoes without following the instructions of your caregivers.   °Sit on chairs with arms. Use the chair arms to help push yourself up when arising.  °Arrange for the use of a toilet seat elevator so you are not sitting low.  °· Walk with walker as instructed.  °You may resume a sexual relationship in one month or when given the OK by your caregiver.  °Use walker as long as suggested by your caregivers.  °You may put full weight on your legs and walk as much as is comfortable. °Avoid periods of inactivity such as sitting longer than an hour when not asleep. This helps prevent  blood clots.  °You may return to work once you are cleared by your surgeon.  °Do not drive a car for 6 weeks or until released by your surgeon.  °Do not drive while taking narcotics.  °Wear elastic stockings for two weeks following surgery during the day but you may remove then at night.  °Make sure you keep all of your appointments after your operation with all of your doctors and caregivers. You should call the office at the above phone number and make an appointment for approximately two weeks after the date of your surgery. °Please pick up a stool softener and laxative for home use as long as you are requiring pain medications. °· ICE to the affected hip every three hours for 30 minutes at a time and then as needed for pain and swelling. Continue to use ice on the hip for pain and swelling from surgery. You may notice swelling that will progress down to the foot and ankle.  This is normal after surgery.  Elevate the leg when you are not up walking on it.   °It is important for you to complete the blood thinner medication as prescribed by your doctor. °· Continue to use the breathing machine which will help keep your temperature down.  It is common for your temperature to cycle up and down following surgery, especially at night when you are not up moving around and exerting yourself.  The breathing machine keeps your lungs expanded and your temperature down. ° °RANGE OF MOTION AND STRENGTHENING EXERCISES  °These exercises are   designed to help you keep full movement of your hip joint. Follow your caregiver's or physical therapist's instructions. Perform all exercises about fifteen times, three times per day or as directed. Exercise both hips, even if you have had only one joint replacement. These exercises can be done on a training (exercise) mat, on the floor, on a table or on a bed. Use whatever works the best and is most comfortable for you. Use music or television while you are exercising so that the exercises  are a pleasant break in your day. This will make your life better with the exercises acting as a break in routine you can look forward to.  °Lying on your back, slowly slide your foot toward your buttocks, raising your knee up off the floor. Then slowly slide your foot back down until your leg is straight again.  °Lying on your back spread your legs as far apart as you can without causing discomfort.  °Lying on your side, raise your upper leg and foot straight up from the floor as far as is comfortable. Slowly lower the leg and repeat.  °Lying on your back, tighten up the muscle in the front of your thigh (quadriceps muscles). You can do this by keeping your leg straight and trying to raise your heel off the floor. This helps strengthen the largest muscle supporting your knee.  °Lying on your back, tighten up the muscles of your buttocks both with the legs straight and with the knee bent at a comfortable angle while keeping your heel on the floor.  ° °SKILLED REHAB INSTRUCTIONS: °If the patient is transferred to a skilled rehab facility following release from the hospital, a list of the current medications will be sent to the facility for the patient to continue.  When discharged from the skilled rehab facility, please have the facility set up the patient's Home Health Physical Therapy prior to being released. Also, the skilled facility will be responsible for providing the patient with their medications at time of release from the facility to include their pain medication and their blood thinner medication. If the patient is still at the rehab facility at time of the two week follow up appointment, the skilled rehab facility will also need to assist the patient in arranging follow up appointment in our office and any transportation needs. ° °MAKE SURE YOU:  °Understand these instructions.  °Will watch your condition.  °Will get help right away if you are not doing well or get worse. ° °Pick up stool softner and  laxative for home use following surgery while on pain medications. °Do not remove your dressing. °The dressing is waterproof--it is OK to take showers. °Continue to use ice for pain and swelling after surgery. °Do not use any lotions or creams on the incision until instructed by your surgeon. °Total Hip Protocol. ° ° °

## 2018-12-17 NOTE — Interval H&P Note (Signed)
History and Physical Interval Note:  12/17/2018 8:39 AM  Cassie Nelson  has presented today for surgery, with the diagnosis of Degenerative joint disease right hip.  The various methods of treatment have been discussed with the patient and family. After consideration of risks, benefits and other options for treatment, the patient has consented to  Procedure(s): TOTAL HIP ARTHROPLASTY ANTERIOR APPROACH (Right) as a surgical intervention.  The patient's history has been reviewed, patient examined, no change in status, stable for surgery.  I have reviewed the patient's chart and labs.  Questions were answered to the patient's satisfaction.     Hilton Cork Doil Kamara

## 2018-12-17 NOTE — Op Note (Signed)
OPERATIVE REPORT  SURGEON: Rod Can, MD   ASSISTANT: Nehemiah Massed, PA-C.  PREOPERATIVE DIAGNOSIS: Right hip arthritis.   POSTOPERATIVE DIAGNOSIS: Right hip arthritis.   PROCEDURE: Right total hip arthroplasty, anterior approach.   IMPLANTS: DePuy Tri Lock stem, size 6, std offset. DePuy Pinnacle Cup, size 56 mm. DePuy Altrx liner, size 36 by 56 mm, +4 neutral. DePuy Biolox ceramic head ball, size 36 + 1.5 mm.  ANESTHESIA:  MAC and Spinal  ESTIMATED BLOOD LOSS:-250 mL    ANTIBIOTICS: 2 g Ancef.  DRAINS: None.  COMPLICATIONS: None.   CONDITION: PACU - hemodynamically stable.   BRIEF CLINICAL NOTE: Cassie Nelson is a 68 y.o. female with a long-standing history of Right hip arthritis. After failing conservative management, the patient was indicated for total hip arthroplasty. The risks, benefits, and alternatives to the procedure were explained, and the patient elected to proceed.  PROCEDURE IN DETAIL: Surgical site was marked by myself in the pre-op holding area. Once inside the operating room, spinal anesthesia was obtained, and a foley catheter was inserted. The patient was then positioned on the Hana table.  All bony prominences were well padded.  The hip was prepped and draped in the normal sterile surgical fashion.  A time-out was called verifying side and site of surgery. The patient received IV antibiotics within 60 minutes of beginning the procedure.   The direct anterior approach to the hip was performed through the Hueter interval.  Lateral femoral circumflex vessels were treated with the Auqumantys. The anterior capsule was exposed and an inverted T capsulotomy was made. The femoral neck cut was made to the level of the templated cut.  A corkscrew was placed into the head and the head was removed.  The femoral head was found to have eburnated bone. The head was passed to the back table and was measured.   Acetabular exposure was achieved, and the pulvinar  and labrum were excised. Sequential reaming of the acetabulum was then performed up to a size 55 mm reamer. A 56 mm cup was then opened and impacted into place at approximately 40 degrees of abduction and 20 degrees of anteversion. The final polyethylene liner was impacted into place and acetabular osteophytes were removed.    I then gained femoral exposure taking care to protect the abductors and greater trochanter.  This was performed using standard external rotation, extension, and adduction.  The capsule was peeled off the inner aspect of the greater trochanter, taking care to preserve the short external rotators. A cookie cutter was used to enter the femoral canal, and then the femoral canal finder was placed.  Sequential broaching was performed up to a size 6.  Calcar planer was used on the femoral neck remnant.  I placed a std offset neck and a trial head ball.  The hip was reduced.  Leg lengths and offset were checked fluoroscopically.  The hip was dislocated and trial components were removed.  The final implants were placed, and the hip was reduced.  Fluoroscopy was used to confirm component position and leg lengths.  At 90 degrees of external rotation and full extension, the hip was stable to an anterior directed force.   The wound was copiously irrigated with Irrisept solution and normal saline using pule lavage.  Marcaine solution was injected into the periarticular soft tissue.  The wound was closed in layers using #1 Stratafix for the fascia, 2-0 Vicryl for the subcutaneous fat, 2-0 Monocryl for the deep dermal layer, 3-0 running Monocryl  subcuticular stitch, and Dermabond for the skin.  Once the glue was fully dried, an Aquacell Ag dressing was applied.  The patient was transported to the recovery room in stable condition.  Sponge, needle, and instrument counts were correct at the end of the case x2.  The patient tolerated the procedure well and there were no known complications.  Please note  that a surgical assistant was a medical necessity for this procedure to perform it in a safe and expeditious manner. Assistant was necessary to provide appropriate retraction of vital neurovascular structures, to prevent femoral fracture, and to allow for anatomic placement of the prosthesis.

## 2018-12-18 ENCOUNTER — Encounter (HOSPITAL_COMMUNITY): Payer: Self-pay | Admitting: Orthopedic Surgery

## 2018-12-19 NOTE — Anesthesia Postprocedure Evaluation (Signed)
Anesthesia Post Note  Patient: Cassie Nelson  Procedure(s) Performed: TOTAL HIP ARTHROPLASTY ANTERIOR APPROACH (Right Hip)     Patient location during evaluation: PACU Anesthesia Type: Spinal Level of consciousness: awake and alert Pain management: pain level controlled Vital Signs Assessment: post-procedure vital signs reviewed and stable Respiratory status: spontaneous breathing and respiratory function stable Cardiovascular status: blood pressure returned to baseline and stable Postop Assessment: spinal receding Anesthetic complications: no    Last Vitals:  Vitals:   12/17/18 1230 12/17/18 1545  BP: 110/62 (!) 134/54  Pulse: 62 82  Resp: 14 14  Temp: 36.6 C 36.7 C  SpO2: 100% 98%    Last Pain:  Vitals:   12/17/18 1545  TempSrc:   PainSc: 0-No pain   Pain Goal: Patients Stated Pain Goal: 5 (12/17/18 0646)                 Tiajuana Amass

## 2018-12-22 ENCOUNTER — Ambulatory Visit: Payer: Medicare HMO

## 2018-12-22 ENCOUNTER — Other Ambulatory Visit: Payer: Medicare HMO

## 2019-01-02 DIAGNOSIS — Z96641 Presence of right artificial hip joint: Secondary | ICD-10-CM | POA: Diagnosis not present

## 2019-01-02 DIAGNOSIS — Z471 Aftercare following joint replacement surgery: Secondary | ICD-10-CM | POA: Diagnosis not present

## 2019-02-02 DIAGNOSIS — Z96641 Presence of right artificial hip joint: Secondary | ICD-10-CM | POA: Diagnosis not present

## 2019-02-02 DIAGNOSIS — Z471 Aftercare following joint replacement surgery: Secondary | ICD-10-CM | POA: Diagnosis not present

## 2019-02-05 ENCOUNTER — Other Ambulatory Visit: Payer: Self-pay | Admitting: Family Medicine

## 2019-02-05 NOTE — Telephone Encounter (Signed)
Last office visit 11/14/2018 for Surgical Clearance.  Last refilled 11/11/2018 for #30 with 2 refills.  Lab only appointment scheduled for 03/23/2019.

## 2019-02-25 ENCOUNTER — Other Ambulatory Visit: Payer: Self-pay | Admitting: Family Medicine

## 2019-02-25 DIAGNOSIS — E2839 Other primary ovarian failure: Secondary | ICD-10-CM

## 2019-02-27 ENCOUNTER — Ambulatory Visit
Admission: RE | Admit: 2019-02-27 | Discharge: 2019-02-27 | Disposition: A | Discharge status: Home / Self Care | Payer: Medicare HMO | Source: Ambulatory Visit | Attending: Family Medicine | Admitting: Family Medicine

## 2019-02-27 ENCOUNTER — Other Ambulatory Visit: Payer: Self-pay

## 2019-02-27 DIAGNOSIS — E2839 Other primary ovarian failure: Secondary | ICD-10-CM

## 2019-02-27 DIAGNOSIS — Z1231 Encounter for screening mammogram for malignant neoplasm of breast: Secondary | ICD-10-CM

## 2019-02-27 DIAGNOSIS — Z780 Asymptomatic menopausal state: Secondary | ICD-10-CM | POA: Diagnosis not present

## 2019-03-02 ENCOUNTER — Other Ambulatory Visit: Payer: Self-pay | Admitting: Family Medicine

## 2019-03-02 DIAGNOSIS — N6489 Other specified disorders of breast: Secondary | ICD-10-CM

## 2019-03-03 NOTE — Progress Notes (Signed)
Abnormal Mammogram.  Needs further imaging.  Will be contacted by  imaging office to schedule further imaging per result note.

## 2019-03-16 ENCOUNTER — Other Ambulatory Visit: Payer: Self-pay

## 2019-03-16 ENCOUNTER — Ambulatory Visit
Admission: RE | Admit: 2019-03-16 | Discharge: 2019-03-16 | Disposition: A | Discharge status: Home / Self Care | Payer: Medicare HMO | Source: Ambulatory Visit | Attending: Family Medicine | Admitting: Family Medicine

## 2019-03-16 DIAGNOSIS — N6489 Other specified disorders of breast: Secondary | ICD-10-CM

## 2019-03-16 DIAGNOSIS — N6012 Diffuse cystic mastopathy of left breast: Secondary | ICD-10-CM | POA: Diagnosis not present

## 2019-03-16 DIAGNOSIS — R928 Other abnormal and inconclusive findings on diagnostic imaging of breast: Secondary | ICD-10-CM | POA: Diagnosis not present

## 2019-03-19 DIAGNOSIS — H52223 Regular astigmatism, bilateral: Secondary | ICD-10-CM | POA: Diagnosis not present

## 2019-03-19 DIAGNOSIS — H524 Presbyopia: Secondary | ICD-10-CM | POA: Diagnosis not present

## 2019-03-19 DIAGNOSIS — H0288A Meibomian gland dysfunction right eye, upper and lower eyelids: Secondary | ICD-10-CM | POA: Diagnosis not present

## 2019-03-19 DIAGNOSIS — H0288B Meibomian gland dysfunction left eye, upper and lower eyelids: Secondary | ICD-10-CM | POA: Diagnosis not present

## 2019-03-19 DIAGNOSIS — H5213 Myopia, bilateral: Secondary | ICD-10-CM | POA: Diagnosis not present

## 2019-03-19 DIAGNOSIS — H2513 Age-related nuclear cataract, bilateral: Secondary | ICD-10-CM | POA: Diagnosis not present

## 2019-03-23 ENCOUNTER — Telehealth: Payer: Self-pay | Admitting: Family Medicine

## 2019-03-23 ENCOUNTER — Other Ambulatory Visit (INDEPENDENT_AMBULATORY_CARE_PROVIDER_SITE_OTHER): Payer: Medicare HMO

## 2019-03-23 ENCOUNTER — Other Ambulatory Visit: Payer: Self-pay

## 2019-03-23 DIAGNOSIS — E7800 Pure hypercholesterolemia, unspecified: Secondary | ICD-10-CM

## 2019-03-23 LAB — COMPREHENSIVE METABOLIC PANEL
ALT: 21 U/L (ref 0–35)
AST: 23 U/L (ref 0–37)
Albumin: 4 g/dL (ref 3.5–5.2)
Alkaline Phosphatase: 123 U/L — ABNORMAL HIGH (ref 39–117)
BUN: 17 mg/dL (ref 6–23)
CO2: 30 mEq/L (ref 19–32)
Calcium: 9.8 mg/dL (ref 8.4–10.5)
Chloride: 103 mEq/L (ref 96–112)
Creatinine, Ser: 0.9 mg/dL (ref 0.40–1.20)
GFR: 62.17 mL/min (ref >60.00)
Glucose, Bld: 90 mg/dL (ref 70–99)
Potassium: 4.5 mEq/L (ref 3.5–5.1)
Sodium: 137 mEq/L (ref 135–145)
Total Bilirubin: 0.4 mg/dL (ref 0.2–1.2)
Total Protein: 7.5 g/dL (ref 6.0–8.3)

## 2019-03-23 LAB — LIPID PANEL
Cholesterol: 162 mg/dL (ref 0–200)
HDL: 38.1 mg/dL — ABNORMAL LOW (ref >39.00)
LDL Cholesterol: 109 mg/dL — ABNORMAL HIGH (ref 0–99)
NonHDL: 123.59
Total CHOL/HDL Ratio: 4
Triglycerides: 73 mg/dL (ref 0.0–149.0)
VLDL: 14.6 mg/dL (ref 0.0–40.0)

## 2019-03-23 NOTE — Telephone Encounter (Signed)
labs

## 2019-04-04 ENCOUNTER — Other Ambulatory Visit: Payer: Self-pay | Admitting: Family Medicine

## 2019-04-21 ENCOUNTER — Other Ambulatory Visit: Payer: Self-pay | Admitting: Family Medicine

## 2019-05-02 ENCOUNTER — Other Ambulatory Visit: Payer: Self-pay | Admitting: Family Medicine

## 2019-06-20 IMAGING — DX DG HIP (WITH OR WITHOUT PELVIS) 2-3V*R*
3 series · 3 of 3 positions shown · non-contrast
Comparison: None.

CLINICAL DATA: Right hip pain without injury.

EXAM:
DG HIP (WITH OR WITHOUT PELVIS) 2-3V RIGHT

[pelvis ap]
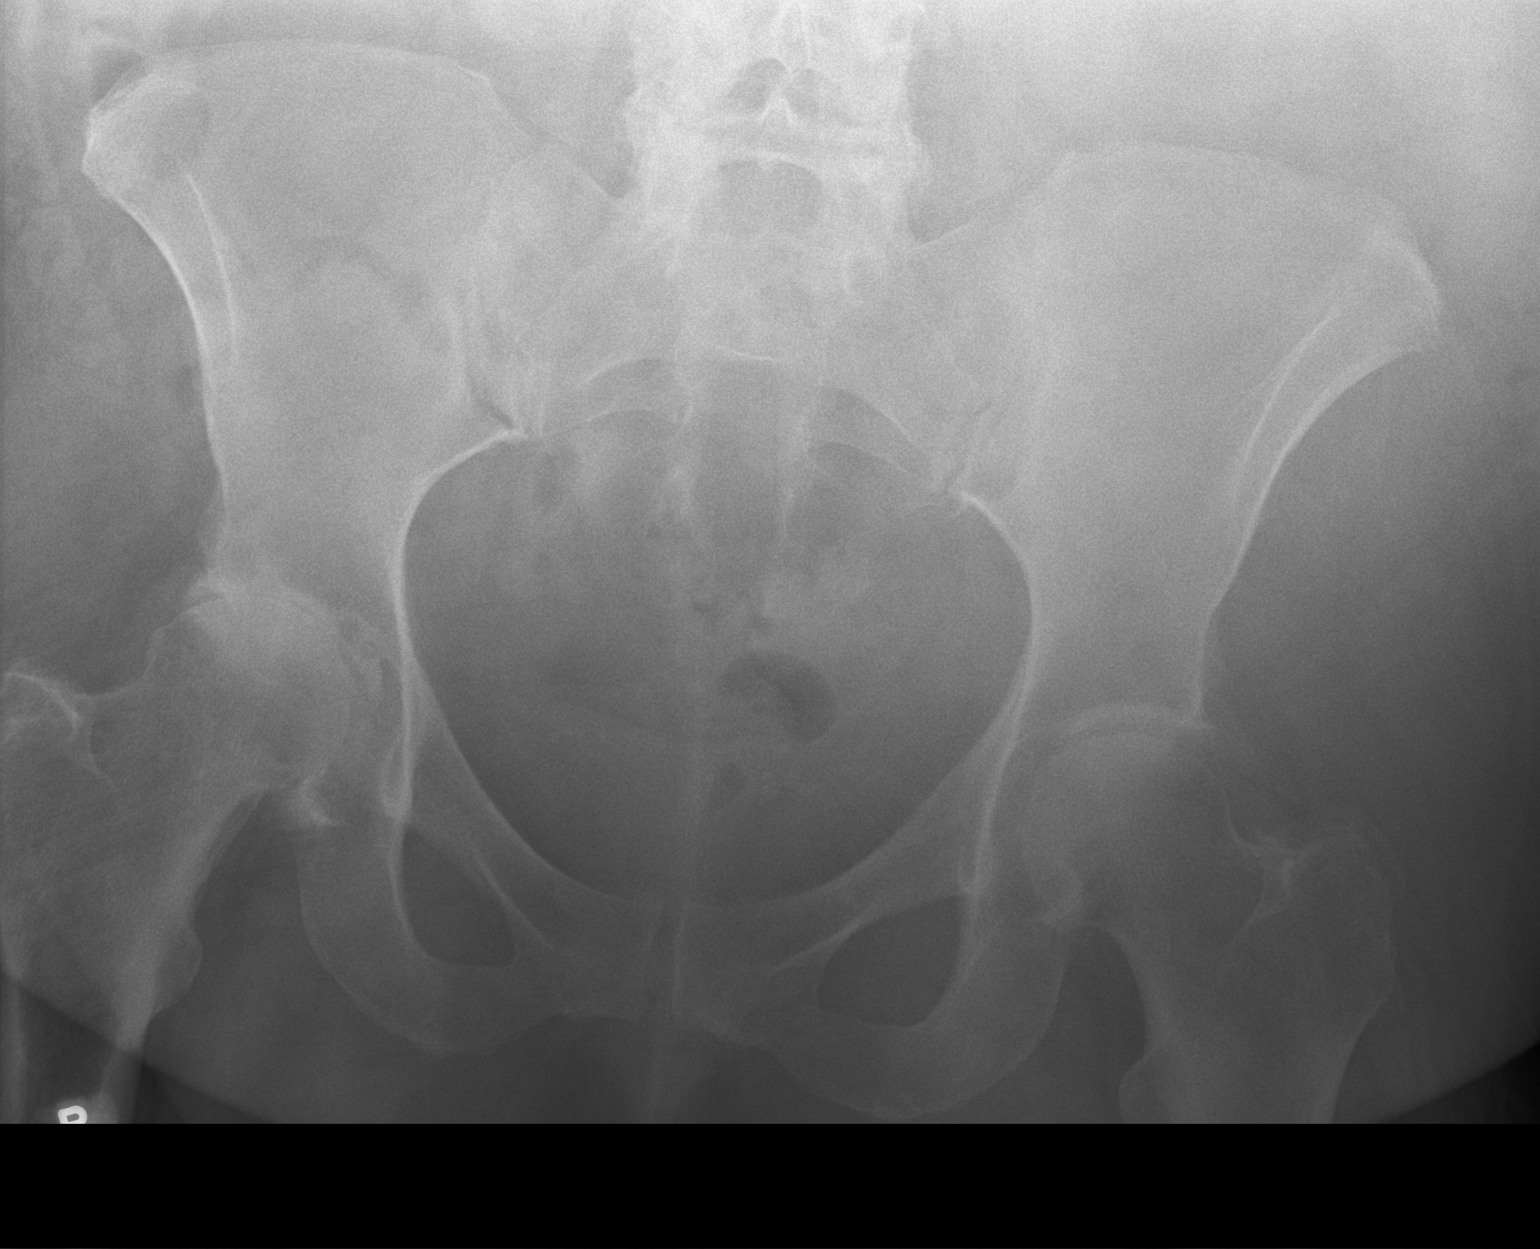

[hip ap]
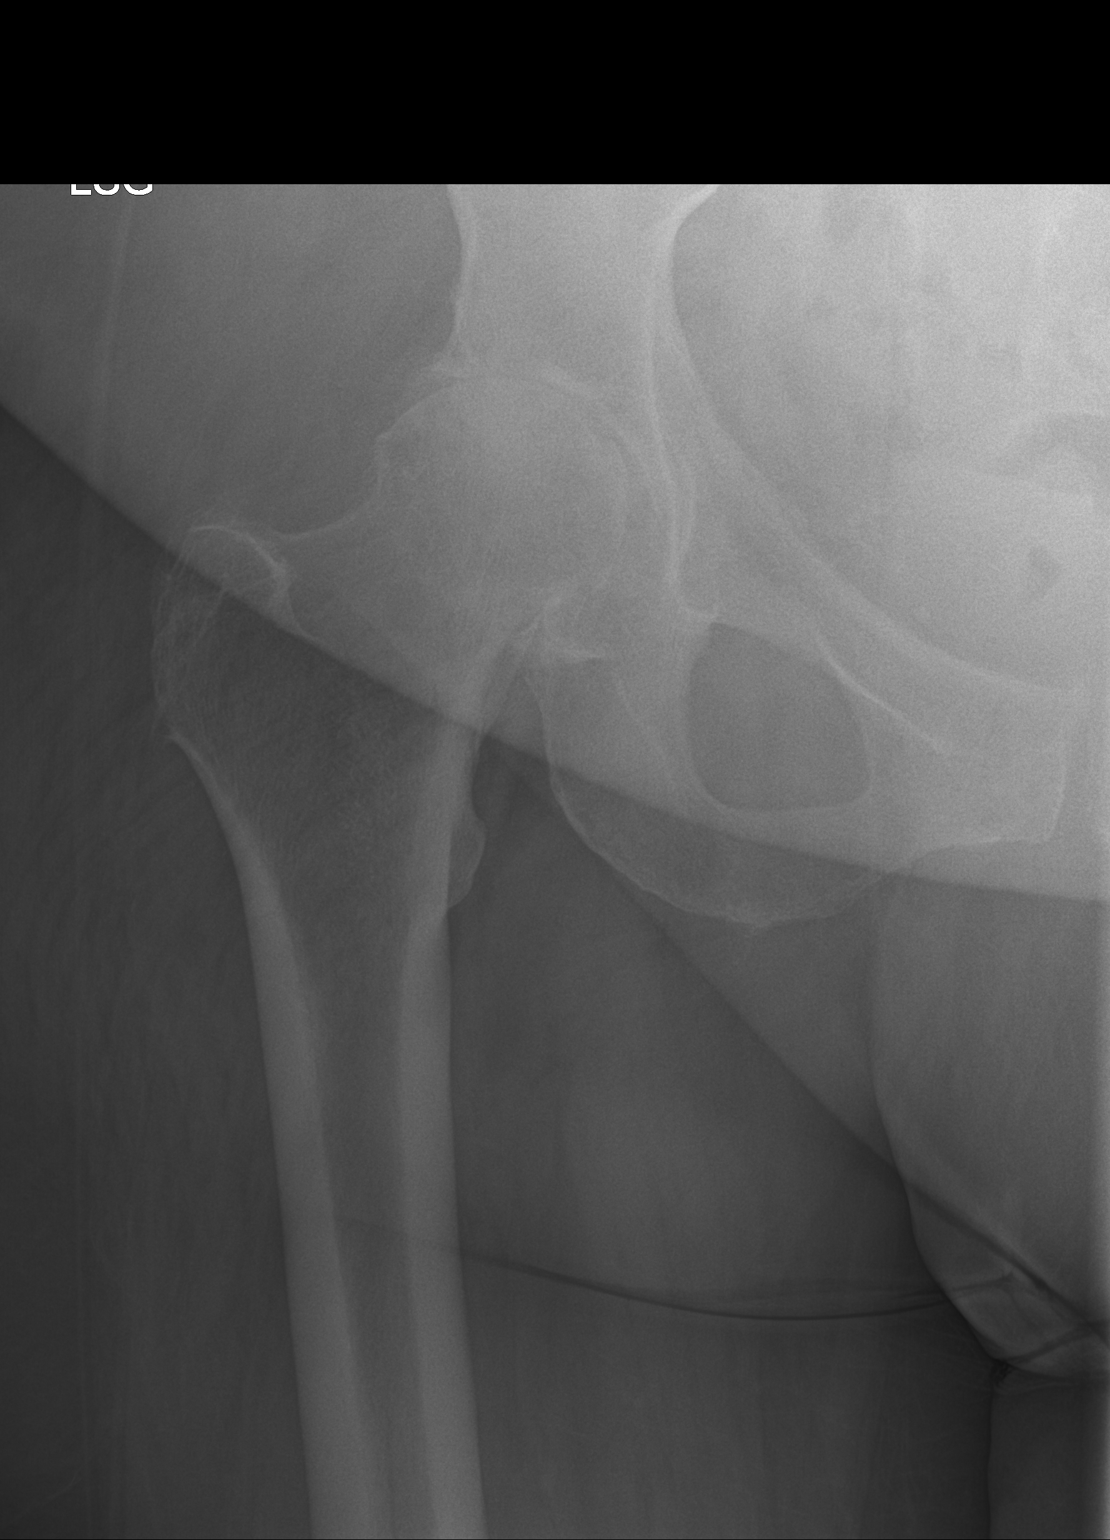

[hip lat]
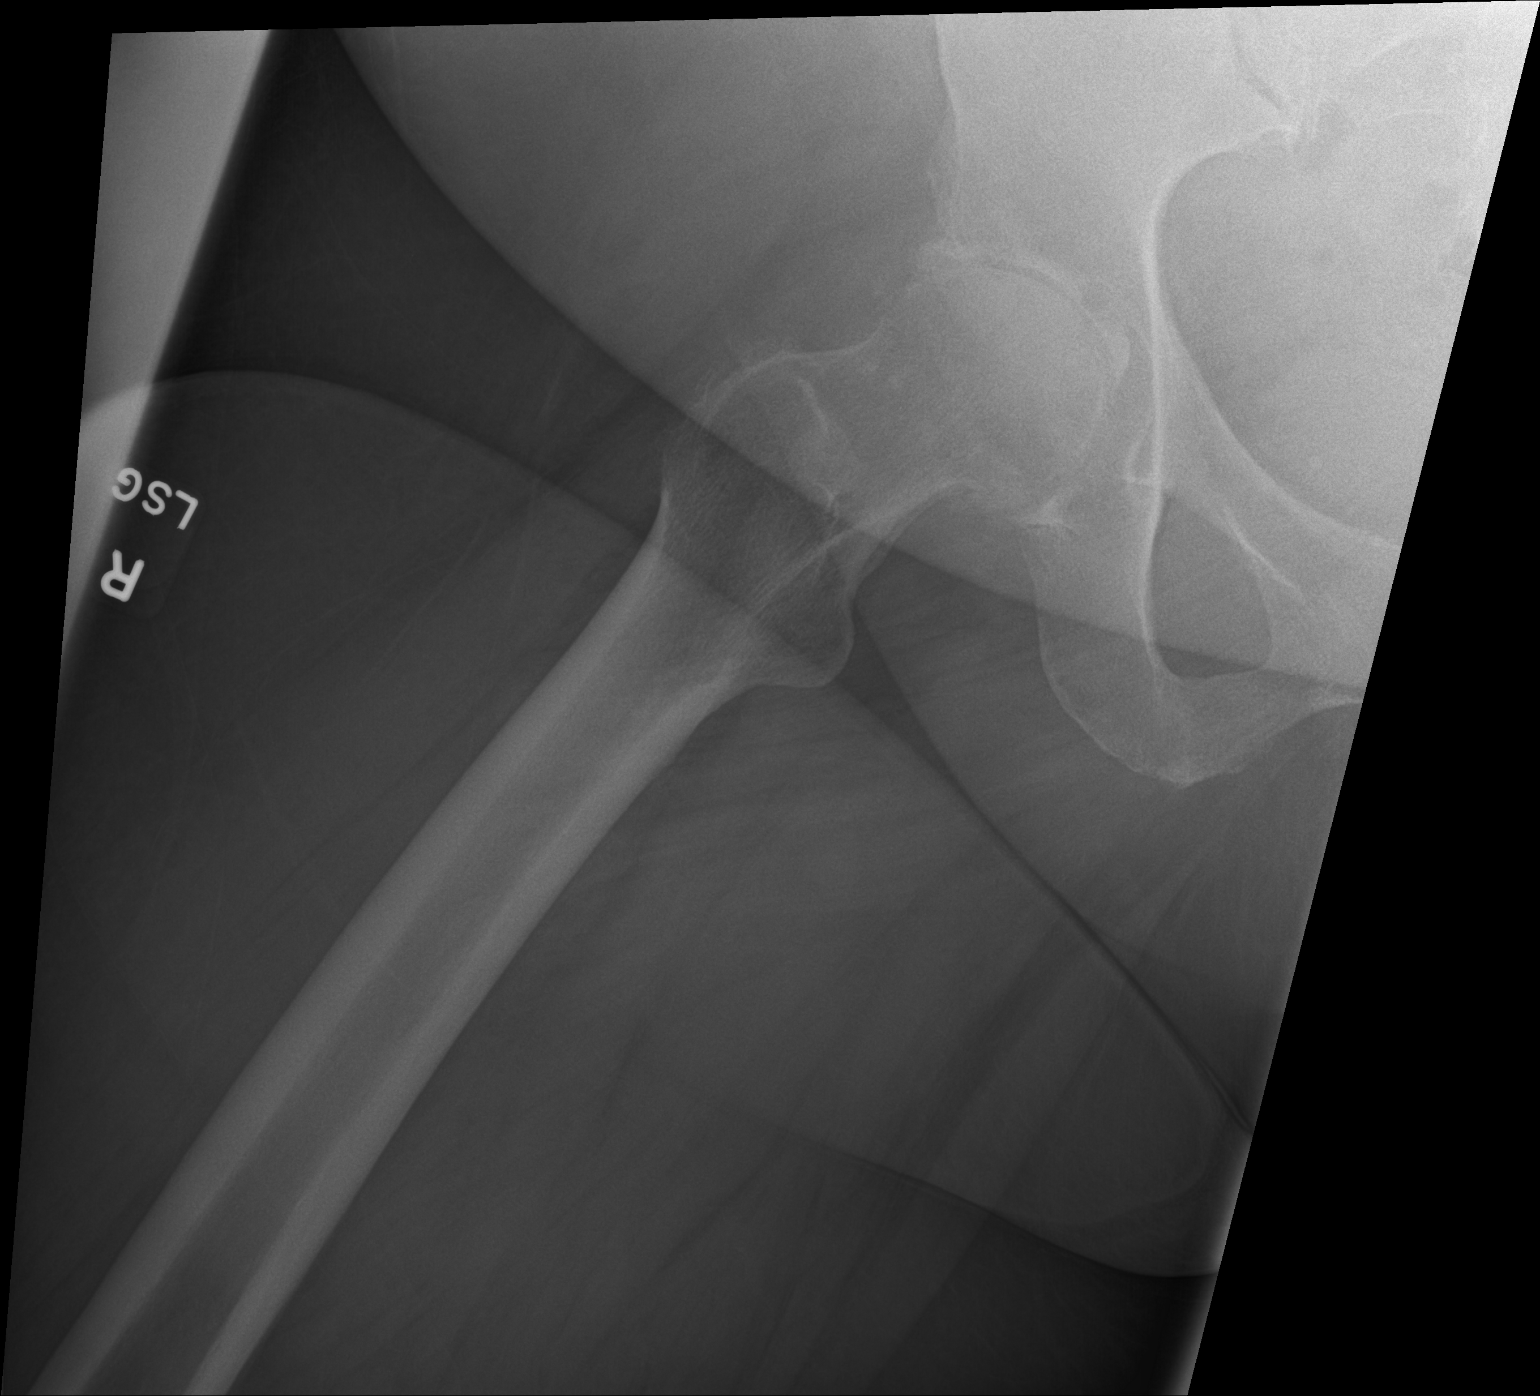

[3 of 3 positions shown; findings below may reference images not displayed]

FINDINGS: Advanced right hip osteoarthritis with superolateral joint
narrowing, sclerosis, and spurring. No fracture or erosive change.
IMPRESSION: Advanced right hip osteoarthritis.

## 2019-07-29 ENCOUNTER — Other Ambulatory Visit: Payer: Self-pay | Admitting: Family Medicine

## 2019-07-29 NOTE — Telephone Encounter (Signed)
Last office visit 11/14/2018 for Surgical Clearance.  Last refilled 05/02/2019 for #30 with 2 refill.  No future appointments.

## 2019-08-24 DIAGNOSIS — Z96641 Presence of right artificial hip joint: Secondary | ICD-10-CM | POA: Diagnosis not present

## 2019-10-03 DIAGNOSIS — E669 Obesity, unspecified: Secondary | ICD-10-CM | POA: Diagnosis not present

## 2019-10-03 DIAGNOSIS — I739 Peripheral vascular disease, unspecified: Secondary | ICD-10-CM | POA: Diagnosis not present

## 2019-10-03 DIAGNOSIS — E785 Hyperlipidemia, unspecified: Secondary | ICD-10-CM | POA: Diagnosis not present

## 2019-10-03 DIAGNOSIS — M1990 Unspecified osteoarthritis, unspecified site: Secondary | ICD-10-CM | POA: Diagnosis not present

## 2019-10-03 DIAGNOSIS — Z791 Long term (current) use of non-steroidal anti-inflammatories (NSAID): Secondary | ICD-10-CM | POA: Diagnosis not present

## 2019-10-03 DIAGNOSIS — Z8249 Family history of ischemic heart disease and other diseases of the circulatory system: Secondary | ICD-10-CM | POA: Diagnosis not present

## 2019-10-03 DIAGNOSIS — Z6830 Body mass index (BMI) 30.0-30.9, adult: Secondary | ICD-10-CM | POA: Diagnosis not present

## 2019-10-03 DIAGNOSIS — Z008 Encounter for other general examination: Secondary | ICD-10-CM | POA: Diagnosis not present

## 2019-10-03 DIAGNOSIS — Z87891 Personal history of nicotine dependence: Secondary | ICD-10-CM | POA: Diagnosis not present

## 2019-10-03 DIAGNOSIS — R32 Unspecified urinary incontinence: Secondary | ICD-10-CM | POA: Diagnosis not present

## 2019-10-03 DIAGNOSIS — I10 Essential (primary) hypertension: Secondary | ICD-10-CM | POA: Diagnosis not present

## 2019-10-08 ENCOUNTER — Telehealth: Payer: Self-pay | Admitting: Family Medicine

## 2019-10-08 DIAGNOSIS — R7303 Prediabetes: Secondary | ICD-10-CM

## 2019-10-08 NOTE — Telephone Encounter (Signed)
-----   Message from Cloyd Stagers, RT sent at 09/25/2019  2:11 PM EDT ----- Regarding: Lab Orders for Friday 9.24.2021 Please place lab orders for Friday 9.24.2021, office visit for physical on Friday 10.1.2021 Thank you, Dyke Maes RT(R)

## 2019-10-09 ENCOUNTER — Other Ambulatory Visit: Payer: Self-pay

## 2019-10-09 ENCOUNTER — Other Ambulatory Visit (INDEPENDENT_AMBULATORY_CARE_PROVIDER_SITE_OTHER): Payer: Medicare HMO

## 2019-10-09 DIAGNOSIS — R7303 Prediabetes: Secondary | ICD-10-CM | POA: Diagnosis not present

## 2019-10-09 LAB — COMPREHENSIVE METABOLIC PANEL
ALT: 35 U/L (ref 0–35)
AST: 32 U/L (ref 0–37)
Albumin: 4.2 g/dL (ref 3.5–5.2)
Alkaline Phosphatase: 118 U/L — ABNORMAL HIGH (ref 39–117)
BUN: 20 mg/dL (ref 6–23)
CO2: 29 mEq/L (ref 19–32)
Calcium: 9.8 mg/dL (ref 8.4–10.5)
Chloride: 102 mEq/L (ref 96–112)
Creatinine, Ser: 0.88 mg/dL (ref 0.40–1.20)
GFR: 63.7 mL/min (ref >60.00)
Glucose, Bld: 89 mg/dL (ref 70–99)
Potassium: 4 mEq/L (ref 3.5–5.1)
Sodium: 138 mEq/L (ref 135–145)
Total Bilirubin: 0.5 mg/dL (ref 0.2–1.2)
Total Protein: 7.5 g/dL (ref 6.0–8.3)

## 2019-10-09 LAB — HEMOGLOBIN A1C: Hgb A1c MFr Bld: 5.4 % (ref 4.6–6.5)

## 2019-10-09 LAB — LIPID PANEL
Cholesterol: 154 mg/dL (ref 0–200)
HDL: 43.2 mg/dL (ref >39.00)
LDL Cholesterol: 94 mg/dL (ref 0–99)
NonHDL: 110.9
Total CHOL/HDL Ratio: 4
Triglycerides: 85 mg/dL (ref 0.0–149.0)
VLDL: 17 mg/dL (ref 0.0–40.0)

## 2019-10-09 NOTE — Progress Notes (Signed)
No critical labs need to be addressed urgently. We will discuss labs in detail at upcoming office visit.   

## 2019-10-14 ENCOUNTER — Other Ambulatory Visit: Payer: Self-pay | Admitting: Family Medicine

## 2019-10-16 ENCOUNTER — Encounter: Payer: Self-pay | Admitting: Family Medicine

## 2019-10-16 ENCOUNTER — Ambulatory Visit (INDEPENDENT_AMBULATORY_CARE_PROVIDER_SITE_OTHER): Payer: Medicare HMO | Admitting: Family Medicine

## 2019-10-16 ENCOUNTER — Other Ambulatory Visit: Payer: Self-pay

## 2019-10-16 VITALS — BP 110/62 | HR 61 | Temp 98.2°F | Ht 67.25 in | Wt 201.0 lb

## 2019-10-16 DIAGNOSIS — Z23 Encounter for immunization: Secondary | ICD-10-CM

## 2019-10-16 DIAGNOSIS — Z0000 Encounter for general adult medical examination without abnormal findings: Secondary | ICD-10-CM | POA: Diagnosis not present

## 2019-10-16 DIAGNOSIS — I10 Essential (primary) hypertension: Secondary | ICD-10-CM | POA: Diagnosis not present

## 2019-10-16 DIAGNOSIS — R7303 Prediabetes: Secondary | ICD-10-CM | POA: Diagnosis not present

## 2019-10-16 DIAGNOSIS — E7800 Pure hypercholesterolemia, unspecified: Secondary | ICD-10-CM | POA: Diagnosis not present

## 2019-10-16 NOTE — Patient Instructions (Addendum)
Great work with lifestyle changes! Keep it going! Decrease lisinopril to one tablet daily, follow BP at home.Marland Kitchen goal < 140/90.  Stop meloxicam.

## 2019-10-16 NOTE — Progress Notes (Signed)
Chief Complaint  Patient presents with  . Medicare Wellness    History of Present Illness: HPI  The patient presents for annual medicare wellness, complete physical and review of chronic health problems. He/She also has the following acute concerns today:  I have personally reviewed the Medicare Annual Wellness questionnaire and have noted 1. The patient's medical and social history 2. Their use of alcohol, tobacco or illicit drugs 3. Their current medications and supplements 4. The patient's functional ability including ADL's, fall risks, home safety risks and hearing or visual             impairment. 5. Diet and physical activities 6. Evidence for depression or mood disorders 7.         Updated provider list Cognitive evaluation was performed and recorded on pt medicare questionnaire form. The patients weight, height, BMI and visual acuity have been recorded in the chart  I have made referrals, counseling and provided education to the patient based review of the above and I have provided the pt with a written personalized care plan for preventive services.   Documentation of this information was scanned into the electronic record under the media tab.   Advance directives and end of life planning reviewed in detail with patient and documented in EMR. Patient given handout on advance care directives if needed. HCPOA and living will updated if needed.   Hearing Screening   Method: Audiometry   125Hz  250Hz  500Hz  1000Hz  2000Hz  3000Hz  4000Hz  6000Hz  8000Hz   Right ear:   20 20 20  20     Left ear:   20 20 20  20     Vision Screening Comments: Wears Glasses-Eye Exam with Dr. Rick Duff 03/2019   Fall Risk  10/16/2019 09/23/2018 07/12/2017 03/09/2016  Falls in the past year? 0 0 No No      Office Visit from 10/16/2019 in [place] at [facility] Total Score 0      R hip replacement last year.. recovered, rehab'd  Followed by [facility].  Able to be more active.  Elevated  Cholesterol:  At goal on red yeast rice. Lab Results  Component Value Date   CHOL 154 10/09/2019   HDL 43.20 10/09/2019   LDLCALC 94 10/09/2019   LDLDIRECT 125.0 07/01/2017   TRIG 85.0 10/09/2019   CHOLHDL 4 10/09/2019  Using medications without problems: Muscle aches:  Diet compliance: healthy eating Exercise: walking 30-40 min daily Other complaints: The 10-year ASCVD risk score Mikey Bussing DC Brooke Bonito., et al., 2013) is: 8.2%   Values used to calculate the score:     Age: 69 years     Sex: Female     Is Non-Hispanic African American: No     Diabetic: No     Tobacco smoker: No     Systolic Blood Pressure: 235 mmHg     Is BP treated: Yes     HDL Cholesterol: 43.2 mg/dL     Total Cholesterol: 154 mg/dL   Hypertension:   Low normal on lisinopril HCTZ  And amlodipine. BP Readings from Last 3 Encounters:  10/16/19 110/62  12/17/18 (!) 134/54  12/15/18 (!) 147/66  Using medication without problems or lightheadedness:  none Chest pain with exertion:none Edema:none Short of breath:none Average home BPs: 105/ 55 Other issues: Wt Readings from Last 3 Encounters:  10/16/19 201 lb (91.2 kg)  12/17/18 250 lb (113.4 kg)  12/15/18 250 lb (113.4 kg)   Body mass index is 31.25 kg/m.  \ prediabetes : resolved. Lab Results  Component Value Date   HGBA1C 5.4 10/09/2019      This visit occurred during the SARS-CoV-2 public health emergency.  Safety protocols were in place, including screening questions prior to the visit, additional usage of staff PPE, and extensive cleaning of exam room while observing appropriate contact time as indicated for disinfecting solutions.   COVID 19 screen:  No recent travel or known exposure to COVID19 The patient denies respiratory symptoms of COVID 19 at this time. The importance of social distancing was discussed today.     Review of Systems  Constitutional: Negative for chills and fever.  HENT: Negative for congestion and ear pain.   Eyes: Negative  for pain and redness.  Respiratory: Negative for cough and shortness of breath.   Cardiovascular: Negative for chest pain, palpitations and leg swelling.  Gastrointestinal: Negative for abdominal pain, blood in stool, constipation, diarrhea, nausea and vomiting.  Genitourinary: Negative for dysuria.  Musculoskeletal: Negative for falls and myalgias.  Skin: Negative for rash.  Neurological: Negative for dizziness.  Psychiatric/Behavioral: Negative for depression. The patient is not nervous/anxious.       Past Medical History:  Diagnosis Date  . Arthritis   . Diverticular disease   . Hx of diverticulitis of colon 2009, 2010  . Hypertension   . Shortness of breath    on exertion    reports that she quit smoking about 9 years ago. She quit after 25.00 years of use. She has never used smokeless tobacco. She reports current alcohol use of about 1.0 standard drink of alcohol per week. She reports that she does not use drugs.   Current Outpatient Medications:  .  amLODipine (NORVASC) 10 MG tablet, TAKE 1 TABLET BY MOUTH EVERY DAY, Disp: 90 tablet, Rfl: 0 .  amoxicillin (AMOXIL) 500 MG capsule, Take 2,000 mg by mouth. 1 to 2 hours prior to dental work, Disp: , Rfl:  .  CALCIUM-VITAMIN D PO, Take 600 mg by mouth daily. , Disp: , Rfl:  .  docusate sodium (COLACE) 100 MG capsule, Take 1 capsule (100 mg total) by mouth 2 (two) times daily., Disp: 60 capsule, Rfl: 3 .  lisinopril-hydrochlorothiazide (ZESTORETIC) 20-12.5 MG tablet, TAKE 2 TABLETS BY MOUTH EVERY DAY, Disp: 180 tablet, Rfl: 0 .  meloxicam (MOBIC) 15 MG tablet, TAKE 1 TABLET BY MOUTH EVERY DAY, Disp: 30 tablet, Rfl: 2 .  Multiple Vitamin (MULTIVITAMIN) tablet, Take 1 tablet by mouth daily. Centrum Silver, Disp: , Rfl:  .  Red Yeast Rice 600 MG CAPS, Take 1,200 mg by mouth 2 (two) times daily. , Disp: , Rfl:    Observations/Objective: Blood pressure 110/62, pulse 61, temperature 98.2 F (36.8 C), temperature source Temporal, height 5'  7.25" (1.708 m), weight 201 lb (91.2 kg), SpO2 99 %.  Physical Exam Constitutional:      General: She is not in acute distress.    Appearance: Normal appearance. She is well-developed. She is not ill-appearing or toxic-appearing.  HENT:     Head: Normocephalic.     Right Ear: Hearing, tympanic membrane, ear canal and external ear normal.     Left Ear: Hearing, tympanic membrane, ear canal and external ear normal.     Nose: Nose normal.  Eyes:     General: Lids are normal. Lids are everted, no foreign bodies appreciated.     Conjunctiva/sclera: Conjunctivae normal.     Pupils: Pupils are equal, round, and reactive to light.  Neck:     Thyroid: No thyroid mass or thyromegaly.  Vascular: No carotid bruit.     Trachea: Trachea normal.  Cardiovascular:     Rate and Rhythm: Normal rate and regular rhythm.     Heart sounds: Normal heart sounds, S1 normal and S2 normal. No murmur heard.  No gallop.   Pulmonary:     Effort: Pulmonary effort is normal. No respiratory distress.     Breath sounds: Normal breath sounds. No wheezing, rhonchi or rales.  Abdominal:     General: Bowel sounds are normal. There is no distension or abdominal bruit.     Palpations: Abdomen is soft. There is no fluid wave or mass.     Tenderness: There is no abdominal tenderness. There is no guarding or rebound.     Hernia: No hernia is present.  Musculoskeletal:     Cervical back: Normal range of motion and neck supple.  Lymphadenopathy:     Cervical: No cervical adenopathy.  Skin:    General: Skin is warm and dry.     Findings: No rash.  Neurological:     Mental Status: She is alert.     Cranial Nerves: No cranial nerve deficit.     Sensory: No sensory deficit.  Psychiatric:        Mood and Affect: Mood is not anxious or depressed.        Speech: Speech normal.        Behavior: Behavior normal. Behavior is cooperative.        Judgment: Judgment normal.      Assessment and Plan  The patient's  preventative maintenance and recommended screening tests for an annual wellness exam were reviewed in full today. Brought up to date unless services declined.  Counselled on the importance of diet, exercise, and its role in overall health and mortality. The patient's FH and SH was reviewed, including their home life, tobacco status, and drug and alcohol status.    Vaccines: Uptodate with Tdap, shingles vaccine,PNA, give flu shot. S/P COVID Mammo:  02/2019 had follow up US, normal pap/dve : Pap not indicated. No DVE given no family history of ovarian uterine cancer Colon: No family history of colon cancer known. Cologuard 07/2017 DEXA: 02/2019 normal.. Repeat in 5 years.  Hep C neg  HTN (hypertension) Well controlled. Continue current medication.   High cholesterol At goal on red yeast rice.  Prediabetes Resolved with lifestyle changes.    Eliezer Lofts, MD

## 2019-10-25 ENCOUNTER — Other Ambulatory Visit: Payer: Self-pay | Admitting: Family Medicine

## 2019-11-23 NOTE — Assessment & Plan Note (Signed)
At goal on red yeast rice.

## 2019-11-23 NOTE — Assessment & Plan Note (Signed)
Well controlled. Continue current medication.  

## 2019-11-23 NOTE — Assessment & Plan Note (Signed)
Resolved with lifestyle changes. 

## 2020-01-07 ENCOUNTER — Other Ambulatory Visit: Payer: Self-pay | Admitting: Family Medicine

## 2020-01-22 ENCOUNTER — Other Ambulatory Visit: Payer: Self-pay | Admitting: Family Medicine

## 2020-02-29 DIAGNOSIS — Z96641 Presence of right artificial hip joint: Secondary | ICD-10-CM | POA: Diagnosis not present

## 2020-03-01 ENCOUNTER — Other Ambulatory Visit: Payer: Self-pay | Admitting: Family Medicine

## 2020-03-01 DIAGNOSIS — Z1231 Encounter for screening mammogram for malignant neoplasm of breast: Secondary | ICD-10-CM

## 2020-04-19 ENCOUNTER — Other Ambulatory Visit: Payer: Self-pay

## 2020-04-19 ENCOUNTER — Ambulatory Visit
Admission: RE | Admit: 2020-04-19 | Discharge: 2020-04-19 | Disposition: A | Discharge status: Home / Self Care | Payer: Medicare HMO | Source: Ambulatory Visit | Attending: Family Medicine | Admitting: Family Medicine

## 2020-04-19 DIAGNOSIS — Z1231 Encounter for screening mammogram for malignant neoplasm of breast: Secondary | ICD-10-CM | POA: Diagnosis not present

## 2020-04-22 ENCOUNTER — Other Ambulatory Visit: Payer: Self-pay | Admitting: Family Medicine

## 2020-04-22 DIAGNOSIS — R928 Other abnormal and inconclusive findings on diagnostic imaging of breast: Secondary | ICD-10-CM

## 2020-05-13 DIAGNOSIS — H2513 Age-related nuclear cataract, bilateral: Secondary | ICD-10-CM | POA: Diagnosis not present

## 2020-05-13 DIAGNOSIS — H52223 Regular astigmatism, bilateral: Secondary | ICD-10-CM | POA: Diagnosis not present

## 2020-05-13 DIAGNOSIS — H5213 Myopia, bilateral: Secondary | ICD-10-CM | POA: Diagnosis not present

## 2020-05-13 DIAGNOSIS — H524 Presbyopia: Secondary | ICD-10-CM | POA: Diagnosis not present

## 2020-05-13 DIAGNOSIS — H0288B Meibomian gland dysfunction left eye, upper and lower eyelids: Secondary | ICD-10-CM | POA: Diagnosis not present

## 2020-05-13 DIAGNOSIS — H0288A Meibomian gland dysfunction right eye, upper and lower eyelids: Secondary | ICD-10-CM | POA: Diagnosis not present

## 2020-05-23 ENCOUNTER — Other Ambulatory Visit: Payer: Self-pay

## 2020-05-23 ENCOUNTER — Ambulatory Visit: Payer: Medicare HMO

## 2020-05-23 ENCOUNTER — Ambulatory Visit
Admission: RE | Admit: 2020-05-23 | Discharge: 2020-05-23 | Disposition: A | Discharge status: Home / Self Care | Payer: Medicare HMO | Source: Ambulatory Visit | Attending: Family Medicine | Admitting: Family Medicine

## 2020-05-23 DIAGNOSIS — R928 Other abnormal and inconclusive findings on diagnostic imaging of breast: Secondary | ICD-10-CM | POA: Diagnosis not present

## 2020-05-23 DIAGNOSIS — R922 Inconclusive mammogram: Secondary | ICD-10-CM | POA: Diagnosis not present

## 2020-06-29 ENCOUNTER — Other Ambulatory Visit: Payer: Self-pay | Admitting: Family Medicine

## 2020-07-16 ENCOUNTER — Other Ambulatory Visit: Payer: Self-pay | Admitting: Family Medicine

## 2020-10-07 DIAGNOSIS — Z87891 Personal history of nicotine dependence: Secondary | ICD-10-CM | POA: Diagnosis not present

## 2020-10-07 DIAGNOSIS — I10 Essential (primary) hypertension: Secondary | ICD-10-CM | POA: Diagnosis not present

## 2020-10-07 DIAGNOSIS — Z8249 Family history of ischemic heart disease and other diseases of the circulatory system: Secondary | ICD-10-CM | POA: Diagnosis not present

## 2020-10-07 DIAGNOSIS — Z6837 Body mass index (BMI) 37.0-37.9, adult: Secondary | ICD-10-CM | POA: Diagnosis not present

## 2020-10-15 ENCOUNTER — Other Ambulatory Visit: Payer: Self-pay | Admitting: Family Medicine

## 2020-10-18 ENCOUNTER — Telehealth: Payer: Self-pay | Admitting: Family Medicine

## 2020-10-18 NOTE — Telephone Encounter (Signed)
Pt called requesting a call back stated she has questions and concern about getting a colonoscopy  [phone]

## 2020-10-19 NOTE — Telephone Encounter (Signed)
Noted  

## 2020-10-19 NOTE — Telephone Encounter (Signed)
Spoke to patient by telephone and was advised that she received an automatic called yesterday questioning her about a colonoscopy. Patient stated that she has been doing colo guard. Patient stated that she has an upcoming  physical scheduled with Dr. Diona Browner in December and will discuss the colonoscopy at that appointment.

## 2020-11-04 ENCOUNTER — Encounter: Payer: Medicare HMO | Admitting: Family Medicine

## 2020-12-11 NOTE — Progress Notes (Signed)
Subjective:   Angelice Piech is a 70 y.o. female who presents for Medicare Annual (Subsequent) preventive examination.  I connected with Gardiner Barefoot [place] today by telephone and verified that I am speaking with the correct person using two identifiers. Location patient: home Location provider: work Persons participating in the virtual visit: patient, Marine scientist.    I discussed the limitations, risks, security and privacy concerns of performing an evaluation and management service by telephone and the availability of in person appointments. I also discussed with the patient that there may be a patient responsible charge related to this service. The patient expressed understanding and verbally consented to this telephonic visit.    Interactive audio and video telecommunications were attempted between this provider and patient, however failed, due to patient having technical difficulties OR patient did not have access to video capability.  We continued and completed visit with audio only.  Some vital signs may be absent or patient reported.   Time Spent with patient on telephone encounter: 25 minutes  Review of Systems     Cardiac Risk Factors include: advanced age (>4men, >27 women);hypertension     Objective:    Today's Vitals   12/14/20 1114  Weight: 225 lb (102.1 kg)   Body mass index is 34.98 kg/m.  Advanced Directives 12/14/2020 12/17/2018 12/15/2018 07/03/2012  Does Patient Have a Medical Advance Directive? Yes Yes Yes Patient does not have advance directive  Type of Advance Directive [place];Living will [place];Living will [place];Living will -  Does patient want to make changes to medical advance directive? Yes (MAU/Ambulatory/Procedural Areas - Information given) - No - Patient declined -  Copy of [place] in Chart? Yes - validated most recent copy scanned in chart (See row information) Yes -  validated most recent copy scanned in chart (See row information) No - copy requested -    Current Medications (verified) Outpatient Encounter Medications as of 12/14/2020  Medication Sig   amLODipine (NORVASC) 10 MG tablet TAKE 1 TABLET BY MOUTH EVERY DAY   amoxicillin (AMOXIL) 500 MG capsule Take 2,000 mg by mouth. 1 to 2 hours prior to dental work   CALCIUM-VITAMIN D PO Take 600 mg by mouth daily.    lisinopril-hydrochlorothiazide (ZESTORETIC) 20-12.5 MG tablet TAKE 2 TABLETS BY MOUTH EVERY DAY (Patient taking differently: Take 1 tablet by mouth daily.)   Multiple Vitamin (MULTIVITAMIN) tablet Take 1 tablet by mouth daily. Centrum Silver   Red Yeast Rice 600 MG CAPS Take 1,200 mg by mouth 2 (two) times daily.    docusate sodium (COLACE) 100 MG capsule Take 1 capsule (100 mg total) by mouth 2 (two) times daily.   No facility-administered encounter medications on file as of 12/14/2020.    Allergies (verified) Levofloxacin   History: Past Medical History:  Diagnosis Date   Arthritis    Diverticular disease    Hx of diverticulitis of colon 2009, 2010   Hypertension    Shortness of breath    on exertion   Past Surgical History:  Procedure Laterality Date   DILITATION & CURRETTAGE/HYSTROSCOPY WITH NOVASURE ABLATION N/A 07/03/2012   Procedure: DILATATION & CURETTAGE/HYSTEROSCOPY WITH NOVASURE ABLATION;  Surgeon: Lavonia Drafts, MD;  Location: [facility];  Service: Gynecology;  Laterality: N/A;   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, surgery at 83 months old   MOUTH SURGERY     biopsy of cheek   TONSILLECTOMY     TONSILLECTOMY  TOTAL HIP ARTHROPLASTY Right 12/17/2018   Procedure: TOTAL HIP ARTHROPLASTY ANTERIOR APPROACH;  Surgeon: Rod Can, MD;  Location: [facility];  Service: Orthopedics;  Laterality: Right;   Family History  Problem Relation Age of Onset   Heart disease Father    Cancer Father 58       colon cancer   Arthritis Father    Hypertension Father     Hypothyroidism Father    Hypertension Mother    Hypothyroidism Mother    Breast cancer Neg Hx    Social History   Socioeconomic History   Marital status: Single    Spouse name: Not on file   Number of children: 0   Years of education: Not on file   Highest education level: Not on file  Occupational History   Occupation: Press photographer rep    Employer: [employer]  Tobacco Use   Smoking status: Former    Years: 25.00    Types: Cigarettes    Quit date: 06/22/2010    Years since quitting: 10.4   Smokeless tobacco: Never  Vaping Use   Vaping Use: Never used  Substance and Sexual Activity   Alcohol use: Yes    Alcohol/week: 1.0 standard drink    Types: 1 Standard drinks or equivalent per week    Comment: occasionally   Drug use: No   Sexual activity: Never  Other Topics Concern   Not on file  Social History Narrative   Single, No kids.   Works as a Orthoptist for [employer] Strain: Low Risk    Difficulty of Paying Living Expenses: Not hard at all  Food Insecurity: No Food Insecurity   Worried About Charity fundraiser in the Last Year: Never true   Arboriculturist in the Last Year: Never true  Transportation Needs: No Data processing manager (Medical): No   Lack of Transportation (Non-Medical): No  Physical Activity: Sufficiently Active   Days of Exercise per Week: 7 days   Minutes of Exercise per Session: 40 min  Stress: No Stress Concern Present   Feeling of Stress : Not at all  Social Connections: Moderately Isolated   Frequency of Communication with Friends and Family: More than three times a week   Frequency of Social Gatherings with Friends and Family: Twice a week   Attends Religious Services: Never   Marine scientist or Organizations: Yes   Attends Music therapist: More than 4 times per year   Marital Status: Never married    Tobacco Counseling Counseling  given: Not Answered   Clinical Intake:  Pre-visit preparation completed: Yes  Pain : No/denies pain     Nutritional Status: BMI > 30  Obese Nutritional Risks: None Diabetes: No  How often do you need to have someone help you when you read instructions, pamphlets, or other written materials from your doctor or pharmacy?: 1 - Never  Diabetic?No  Interpreter Needed?: No  Information entered by :: Orrin Brigham LPN   Activities of Daily Living In your present state of health, do you have any difficulty performing the following activities: 12/14/2020  Hearing? N  Vision? N  Difficulty concentrating or making decisions? N  Walking or climbing stairs? N  Dressing or bathing? N  Doing errands, shopping? N  Preparing Food and eating ? N  Using the Toilet? N  In the past six months, have you  accidently leaked urine? Y  Comment leakage at night  Do you have problems with loss of bowel control? N  Managing your Medications? N  Managing your Finances? N  Housekeeping or managing your Housekeeping? N  Some recent data might be hidden    Patient Care Team: Jinny Sanders, MD as PCP - General (Family Medicine)  Indicate any recent Medical Services you may have received from other than [facility] providers in the past year (date may be approximate).     Assessment:   This is a routine wellness examination for Clary.  Hearing/Vision screen Hearing Screening - Comments:: No issues Vision Screening - Comments:: Last exam 04/2020, Dr. Rick Duff, Wears glasses  Dietary issues and exercise activities discussed: Current Exercise Habits: Home exercise routine, Type of exercise: walking, Time (Minutes): 40, Frequency (Times/Week): 7, Weekly Exercise (Minutes/Week): 280   Goals Addressed             This Visit's Progress    Patient Stated       Would like to get back on track with weight loss.       Depression Screen PHQ 2/9 Scores 12/14/2020 10/16/2019 09/23/2018 07/12/2017  03/09/2016  PHQ - 2 Score 0 0 0 0 0    Fall Risk Fall Risk  12/14/2020 10/16/2019 09/23/2018 07/12/2017 03/09/2016  Falls in the past year? 0 0 0 No No  Number falls in past yr: 0 - - - -  Injury with Fall? 0 - - - -  Risk for fall due to : No Fall Risks - - - -  Follow up Falls prevention discussed - - - -    FALL RISK PREVENTION PERTAINING TO THE HOME:  Any stairs in or around the home? Yes  If so, are there any without handrails? No  Home free of loose throw rugs in walkways, pet beds, electrical cords, etc? Yes  Adequate lighting in your home to reduce risk of falls? Yes   ASSISTIVE DEVICES UTILIZED TO PREVENT FALLS:  Life alert? No  Use of a cane, walker or w/c? No  Grab bars in the bathroom? No  Shower chair or bench in shower? No  Elevated toilet seat or a handicapped toilet? No   TIMED UP AND GO:  Was the test performed? No , visit completed over the phone.   Cognitive Function:  Normal cognitive status assessed by  this Nurse Health Advisor. No abnormalities found.        Immunizations Immunization History  Administered Date(s) Administered   Fluad Quad(high Dose 65+) 10/28/2018, 10/16/2019, 10/18/2020   Influenza,inj,Quad PF,6+ Mos 10/20/2013, 11/11/2014, 11/07/2015, 11/07/2016, 10/09/2017   PFIZER(Purple Top)SARS-COV-2 Vaccination 02/28/2019, 03/25/2019, 10/31/2019   Pfizer Covid-19 Vaccine Bivalent Booster 49yrs & up 10/18/2020   Pneumococcal Conjugate-13 03/09/2016   Pneumococcal Polysaccharide-23 07/12/2017   Tdap 05/22/2012   Zoster, Live 08/04/2013    TDAP status: Up to date  Flu Vaccine status: Up to date  Pneumococcal vaccine status: Up to date  Covid-19 vaccine status: Completed vaccines  Qualifies for Shingles Vaccine? Yes   Zostavax completed Yes   Shingrix Completed?: No.    Education has been provided regarding the importance of this vaccine. Patient has been advised to call insurance company to determine out of pocket expense if they have  not yet received this vaccine. Advised may also receive vaccine at local pharmacy or Health Dept. Verbalized acceptance and understanding.  Screening Tests Health Maintenance  Topic Date Due   Zoster Vaccines- Shingrix (1 of 2) Never  done   Fecal DNA (Cologuard)  07/29/2020   MAMMOGRAM  04/20/2022   TETANUS/TDAP  05/23/2022   Pneumonia Vaccine 70+ Years old  Completed   INFLUENZA VACCINE  Completed   DEXA SCAN  Completed   COVID-19 Vaccine  Completed   Hepatitis C Screening  Completed   HPV VACCINES  Aged Out    Health Maintenance  Health Maintenance Due  Topic Date Due   Zoster Vaccines- Shingrix (1 of 2) Never done   Fecal DNA (Cologuard)  07/29/2020    Colorectal Cancer screening: due, last completed 07/29/17 cologuard ordered today 12/14/20  Mammogram status: Completed 04/19/20. Repeat every year  Bone Density status: Completed 02/27/19. Results reflect: Bone density results: NORMAL. Repeat every 2 years.  Lung Cancer Screening: (Low Dose CT Chest recommended if Age 62-80 years, 30 pack-year currently smoking OR have quit w/in 15years.) does not qualify.     Additional Screening:  Hepatitis C Screening: does qualify; Completed 09/23/18  Vision Screening: Recommended annual ophthalmology exams for early detection of glaucoma and other disorders of the eye. Is the patient up to date with their annual eye exam?  Yes  Who is the provider or what is the name of the office in which the patient attends annual eye exams? Dr. Rick Duff   Dental Screening: Recommended annual dental exams for proper oral hygiene  Community Resource Referral / Chronic Care Management: CRR required this visit?  No   CCM required this visit?  No      Plan:     I have personally reviewed and noted the following in the patient's chart:   Medical and social history Use of alcohol, tobacco or illicit drugs  Current medications and supplements including opioid prescriptions.  Functional  ability and status Nutritional status Physical activity Advanced directives List of other physicians Hospitalizations, surgeries, and ER visits in previous 12 months Vitals Screenings to include cognitive, depression, and falls Referrals and appointments  In addition, I have reviewed and discussed with patient certain preventive protocols, quality metrics, and best practice recommendations. A written personalized care plan for preventive services as well as general preventive health recommendations were provided to patient.   Due to this being a telephonic visit, the after visit summary with patients personalized plan was offered to patient via mail or my-chart. Patient would like to access on my-chart.   Loma Messing, LPN   [id number]   Nurse Health Advisor  Nurse Notes: none

## 2020-12-14 ENCOUNTER — Ambulatory Visit (INDEPENDENT_AMBULATORY_CARE_PROVIDER_SITE_OTHER): Payer: Medicare HMO

## 2020-12-14 VITALS — Wt 225.0 lb

## 2020-12-14 DIAGNOSIS — Z1211 Encounter for screening for malignant neoplasm of colon: Secondary | ICD-10-CM

## 2020-12-14 DIAGNOSIS — Z0000 Encounter for general adult medical examination without abnormal findings: Secondary | ICD-10-CM | POA: Diagnosis not present

## 2020-12-14 NOTE — Patient Instructions (Signed)
[name] , Thank you for taking time to complete your Medicare Wellness Visit. I appreciate your ongoing commitment to your health goals. Please review the following plan we discussed and let me know if I can assist you in the future.   Screening recommendations/referrals: Colonoscopy: due, cologuard last completed 07/29/17, ordered cologuard today Mammogram: up to date, completed 04/29/20, due 04/29/21 Bone Density: up to date, completed 02/27/19, due 02/26/21 Recommended yearly ophthalmology/optometry visit for glaucoma screening and checkup Recommended yearly dental visit for hygiene and checkup  Vaccinations: Influenza vaccine: up to date Pneumococcal vaccine: up to date Tdap vaccine: up to date, completed 05/22/12, due 05/23/22 Shingles vaccine: Discuss with your local pharmacy Covid-19:up to date  Advanced directives: copy on file  Conditions/risks identified: see problem list  Next appointment: Follow up in one year for your annual wellness visit 12/15/21 @ 10:30am , this will be a telephone visit   Preventive Care 65 Years and Older, Female Preventive care refers to lifestyle choices and visits with your health care provider that can promote health and wellness. What does preventive care include? A yearly physical exam. This is also called an annual well check. Dental exams once or twice a year. Routine eye exams. Ask your health care provider how often you should have your eyes checked. Personal lifestyle choices, including: Daily care of your teeth and gums. Regular physical activity. Eating a healthy diet. Avoiding tobacco and drug use. Limiting alcohol use. Practicing safe sex. Taking low-dose aspirin every day. Taking vitamin and mineral supplements as recommended by your health care provider. What happens during an annual well check? The services and screenings done by your health care provider during your annual well check will depend on your age, overall health,  lifestyle risk factors, and family history of disease. Counseling  Your health care provider may ask you questions about your: Alcohol use. Tobacco use. Drug use. Emotional well-being. Home and relationship well-being. Sexual activity. Eating habits. History of falls. Memory and ability to understand (cognition). Work and work Statistician. Reproductive health. Screening  You may have the following tests or measurements: Height, weight, and BMI. Blood pressure. Lipid and cholesterol levels. These may be checked every 5 years, or more frequently if you are over 92 years old. Skin check. Lung cancer screening. You may have this screening every year starting at age 22 if you have a 30-pack-year history of smoking and currently smoke or have quit within the past 15 years. Fecal occult blood test (FOBT) of the stool. You may have this test every year starting at age 42. Flexible sigmoidoscopy or colonoscopy. You may have a sigmoidoscopy every 5 years or a colonoscopy every 10 years starting at age 76. Hepatitis C blood test. Hepatitis B blood test. Sexually transmitted disease (STD) testing. Diabetes screening. This is done by checking your blood sugar (glucose) after you have not eaten for a while (fasting). You may have this done every 1-3 years. Bone density scan. This is done to screen for osteoporosis. You may have this done starting at age 4. Mammogram. This may be done every 1-2 years. Talk to your health care provider about how often you should have regular mammograms. Talk with your health care provider about your test results, treatment options, and if necessary, the need for more tests. Vaccines  Your health care provider may recommend certain vaccines, such as: Influenza vaccine. This is recommended every year. Tetanus, diphtheria, and acellular pertussis (Tdap, Td) vaccine. You may need a Td booster every 10  years. Zoster vaccine. You may need this after age  15. Pneumococcal 13-valent conjugate (PCV13) vaccine. One dose is recommended after age 63. Pneumococcal polysaccharide (PPSV23) vaccine. One dose is recommended after age 49. Talk to your health care provider about which screenings and vaccines you need and how often you need them. This information is not intended to replace advice given to you by your health care provider. Make sure you discuss any questions you have with your health care provider. Document Released: 01/28/2015 Document Revised: 09/21/2015 Document Reviewed: 11/02/2014 Elsevier Interactive Patient Education  2017 [place] Prevention in the Home Falls can cause injuries. They can happen to people of all ages. There are many things you can do to make your home safe and to help prevent falls. What can I do on the outside of my home? Regularly fix the edges of walkways and driveways and fix any cracks. Remove anything that might make you trip as you walk through a door, such as a raised step or threshold. Trim any bushes or trees on the path to your home. Use bright outdoor lighting. Clear any walking paths of anything that might make someone trip, such as rocks or tools. Regularly check to see if handrails are loose or broken. Make sure that both sides of any steps have handrails. Any raised decks and porches should have guardrails on the edges. Have any leaves, snow, or ice cleared regularly. Use sand or salt on walking paths during winter. Clean up any spills in your garage right away. This includes oil or grease spills. What can I do in the bathroom? Use night lights. Install grab bars by the toilet and in the tub and shower. Do not use towel bars as grab bars. Use non-skid mats or decals in the tub or shower. If you need to sit down in the shower, use a plastic, non-slip stool. Keep the floor dry. Clean up any water that spills on the floor as soon as it happens. Remove soap buildup in the tub or shower  regularly. Attach bath mats securely with double-sided non-slip rug tape. Do not have throw rugs and other things on the floor that can make you trip. What can I do in the bedroom? Use night lights. Make sure that you have a light by your bed that is easy to reach. Do not use any sheets or blankets that are too big for your bed. They should not hang down onto the floor. Have a firm chair that has side arms. You can use this for support while you get dressed. Do not have throw rugs and other things on the floor that can make you trip. What can I do in the kitchen? Clean up any spills right away. Avoid walking on wet floors. Keep items that you use a lot in easy-to-reach places. If you need to reach something above you, use a strong step stool that has a grab bar. Keep electrical cords out of the way. Do not use floor polish or wax that makes floors slippery. If you must use wax, use non-skid floor wax. Do not have throw rugs and other things on the floor that can make you trip. What can I do with my stairs? Do not leave any items on the stairs. Make sure that there are handrails on both sides of the stairs and use them. Fix handrails that are broken or loose. Make sure that handrails are as long as the stairways. Check any carpeting to make sure  that it is firmly attached to the stairs. Fix any carpet that is loose or worn. Avoid having throw rugs at the top or bottom of the stairs. If you do have throw rugs, attach them to the floor with carpet tape. Make sure that you have a light switch at the top of the stairs and the bottom of the stairs. If you do not have them, ask someone to add them for you. What else can I do to help prevent falls? Wear shoes that: Do not have high heels. Have rubber bottoms. Are comfortable and fit you well. Are closed at the toe. Do not wear sandals. If you use a stepladder: Make sure that it is fully opened. Do not climb a closed stepladder. Make sure that  both sides of the stepladder are locked into place. Ask someone to hold it for you, if possible. Clearly mark and make sure that you can see: Any grab bars or handrails. First and last steps. Where the edge of each step is. Use tools that help you move around (mobility aids) if they are needed. These include: Canes. Walkers. Scooters. Crutches. Turn on the lights when you go into a dark area. Replace any light bulbs as soon as they burn out. Set up your furniture so you have a clear path. Avoid moving your furniture around. If any of your floors are uneven, fix them. If there are any pets around you, be aware of where they are. Review your medicines with your doctor. Some medicines can make you feel dizzy. This can increase your chance of falling. Ask your doctor what other things that you can do to help prevent falls. This information is not intended to replace advice given to you by your health care provider. Make sure you discuss any questions you have with your health care provider. Document Released: 10/28/2008 Document Revised: 06/09/2015 Document Reviewed: 02/05/2014 Elsevier Interactive Patient Education  2017 [employer].

## 2020-12-15 ENCOUNTER — Other Ambulatory Visit: Payer: Medicare HMO

## 2020-12-22 ENCOUNTER — Encounter: Payer: Medicare HMO | Admitting: Family Medicine

## 2020-12-22 ENCOUNTER — Other Ambulatory Visit: Payer: Self-pay | Admitting: Family Medicine

## 2021-01-12 ENCOUNTER — Other Ambulatory Visit: Payer: Self-pay | Admitting: Family Medicine

## 2021-01-17 DIAGNOSIS — Z1211 Encounter for screening for malignant neoplasm of colon: Secondary | ICD-10-CM | POA: Diagnosis not present

## 2021-01-24 LAB — COLOGUARD: COLOGUARD: POSITIVE — AB

## 2021-01-26 ENCOUNTER — Encounter: Payer: Self-pay | Admitting: Family Medicine

## 2021-01-26 DIAGNOSIS — Z1211 Encounter for screening for malignant neoplasm of colon: Secondary | ICD-10-CM

## 2021-02-01 ENCOUNTER — Telehealth: Payer: Self-pay

## 2021-02-01 ENCOUNTER — Other Ambulatory Visit: Payer: Self-pay

## 2021-02-01 DIAGNOSIS — Z1211 Encounter for screening for malignant neoplasm of colon: Secondary | ICD-10-CM

## 2021-02-01 MED ORDER — NA SULFATE-K SULFATE-MG SULF 17.5-3.13-1.6 GM/177ML PO SOLN: 1.0000 | Freq: Once | ORAL | 0 refills | Status: AC

## 2021-02-01 NOTE — Progress Notes (Signed)
Gastroenterology Pre-Procedure Review  Request Date: 02/21/2021 Requesting Physician: Dr. Vicente Males  PATIENT REVIEW QUESTIONS: The patient responded to the following health history questions as indicated:    1. Are you having any GI issues? no 2. Do you have a personal history of Polyps? no 3. Do you have a family history of Colon Cancer or Polyps? yes (FATHER COLON CANCER) 4. Diabetes Mellitus? no 5. Joint replacements in the past 12 months?no 6. Major health problems in the past 3 months?no 7. Any artificial heart valves, MVP, or defibrillator?no    MEDICATIONS & ALLERGIES:    Patient reports the following regarding taking any anticoagulation/antiplatelet therapy:   Plavix, Coumadin, Eliquis, Xarelto, Lovenox, Pradaxa, Brilinta, or Effient? no Aspirin? no  Patient confirms/reports the following medications:  Current Outpatient Medications  Medication Sig Dispense Refill   amLODipine (NORVASC) 10 MG tablet TAKE 1 TABLET BY MOUTH EVERY DAY 90 tablet 0   amoxicillin (AMOXIL) 500 MG capsule Take 2,000 mg by mouth. 1 to 2 hours prior to dental work     CALCIUM-VITAMIN D PO Take 600 mg by mouth daily.      docusate sodium (COLACE) 100 MG capsule Take 1 capsule (100 mg total) by mouth 2 (two) times daily. 60 capsule 3   lisinopril-hydrochlorothiazide (ZESTORETIC) 20-12.5 MG tablet Take 1 tablet by mouth daily. 90 tablet 0   Multiple Vitamin (MULTIVITAMIN) tablet Take 1 tablet by mouth daily. Centrum Silver     Red Yeast Rice 600 MG CAPS Take 1,200 mg by mouth 2 (two) times daily.      No current facility-administered medications for this visit.    Patient confirms/reports the following allergies:  Allergies  Allergen Reactions   Levofloxacin Other (See Comments)    Joint Pain    No orders of the defined types were placed in this encounter.   AUTHORIZATION INFORMATION Primary Insurance: 1D#: Group #:  Secondary Insurance: 1D#: Group #:  SCHEDULE INFORMATION: Date:  02/21/2021 Time: Location:[facility]

## 2021-02-01 NOTE — Telephone Encounter (Signed)
Inbound call from pt requesting a call back to schedule an colonoscopy. Thank you.

## 2021-02-02 ENCOUNTER — Telehealth: Payer: Self-pay

## 2021-02-02 NOTE — Telephone Encounter (Signed)
Scheduled for 02/21/2021 colonoscopy

## 2021-02-06 ENCOUNTER — Telehealth: Payer: Self-pay | Admitting: Family Medicine

## 2021-02-06 ENCOUNTER — Telehealth: Payer: Self-pay

## 2021-02-06 DIAGNOSIS — R7303 Prediabetes: Secondary | ICD-10-CM

## 2021-02-06 DIAGNOSIS — E7800 Pure hypercholesterolemia, unspecified: Secondary | ICD-10-CM

## 2021-02-06 NOTE — Telephone Encounter (Signed)
-----   Message from [name] sent at 01/23/2021  8:10 AM EST ----- Regarding: Lab orders for Tuesday, 1.24.23 Patient is scheduled for CPX labs, please order future labs, Thanks , Karna Christmas

## 2021-02-06 NOTE — Telephone Encounter (Signed)
Patient called to reschedule her procedure till 03/08/2021 sent new referral new communications and called endo

## 2021-02-07 ENCOUNTER — Other Ambulatory Visit: Payer: Self-pay

## 2021-02-07 ENCOUNTER — Other Ambulatory Visit (INDEPENDENT_AMBULATORY_CARE_PROVIDER_SITE_OTHER): Payer: Medicare HMO

## 2021-02-07 DIAGNOSIS — R7303 Prediabetes: Secondary | ICD-10-CM | POA: Diagnosis not present

## 2021-02-07 DIAGNOSIS — E7800 Pure hypercholesterolemia, unspecified: Secondary | ICD-10-CM

## 2021-02-07 LAB — COMPREHENSIVE METABOLIC PANEL
ALT: 22 U/L (ref 0–35)
AST: 28 U/L (ref 0–37)
Albumin: 4.4 g/dL (ref 3.5–5.2)
Alkaline Phosphatase: 81 U/L (ref 39–117)
BUN: 21 mg/dL (ref 6–23)
CO2: 28 mEq/L (ref 19–32)
Calcium: 10.2 mg/dL (ref 8.4–10.5)
Chloride: 101 mEq/L (ref 96–112)
Creatinine, Ser: 0.83 mg/dL (ref 0.40–1.20)
GFR: 71.43 mL/min (ref >60.00)
Glucose, Bld: 102 mg/dL — ABNORMAL HIGH (ref 70–99)
Potassium: 4.3 mEq/L (ref 3.5–5.1)
Sodium: 136 mEq/L (ref 135–145)
Total Bilirubin: 0.5 mg/dL (ref 0.2–1.2)
Total Protein: 8.2 g/dL (ref 6.0–8.3)

## 2021-02-07 LAB — LIPID PANEL
Cholesterol: 190 mg/dL (ref 0–200)
HDL: 45.3 mg/dL (ref >39.00)
LDL Cholesterol: 127 mg/dL — ABNORMAL HIGH (ref 0–99)
NonHDL: 145.1
Total CHOL/HDL Ratio: 4
Triglycerides: 92 mg/dL (ref 0.0–149.0)
VLDL: 18.4 mg/dL (ref 0.0–40.0)

## 2021-02-07 LAB — HEMOGLOBIN A1C: Hgb A1c MFr Bld: 5.4 % (ref 4.6–6.5)

## 2021-02-07 NOTE — Progress Notes (Signed)
No critical labs need to be addressed urgently. We will discuss labs in detail at upcoming office visit.   

## 2021-02-14 ENCOUNTER — Encounter: Payer: Self-pay | Admitting: Family Medicine

## 2021-02-14 ENCOUNTER — Other Ambulatory Visit: Payer: Self-pay

## 2021-02-14 ENCOUNTER — Ambulatory Visit (INDEPENDENT_AMBULATORY_CARE_PROVIDER_SITE_OTHER): Payer: Medicare HMO | Admitting: Family Medicine

## 2021-02-14 VITALS — BP 131/70 | HR 69 | Temp 98.4°F | Ht 67.0 in | Wt 264.0 lb

## 2021-02-14 DIAGNOSIS — Z0000 Encounter for general adult medical examination without abnormal findings: Secondary | ICD-10-CM

## 2021-02-14 DIAGNOSIS — R7303 Prediabetes: Secondary | ICD-10-CM | POA: Diagnosis not present

## 2021-02-14 DIAGNOSIS — E7800 Pure hypercholesterolemia, unspecified: Secondary | ICD-10-CM

## 2021-02-14 DIAGNOSIS — I10 Essential (primary) hypertension: Secondary | ICD-10-CM

## 2021-02-14 NOTE — Patient Instructions (Addendum)
Keep working on healthy eating and getting back to exercise. Make an appt for fasting labs in 3 months... for recheck cholesterol,BMET.  Make sure to set up colonoscopy as discussed.

## 2021-02-14 NOTE — Assessment & Plan Note (Signed)
Encouraged exercise, weight loss, healthy eating habits. ? ?

## 2021-02-14 NOTE — Assessment & Plan Note (Addendum)
10 year risk at 16%.Marland Kitchen statin indicated  She will restart lifestyle changes.. recheck in 3 months.. if not at goal .. consider medication to treat.

## 2021-02-14 NOTE — Progress Notes (Signed)
Patient ID: Cassie Nelson, female    DOB: 1950-11-29, 70 y.o.   MRN: [id number]  This visit was conducted in person.  BP (!) 146/60    Pulse 69    Temp 98.4 F (36.9 C) (Temporal)    Ht 5\' 7"  (1.702 m)    Wt 264 lb (119.7 kg)    SpO2 97%    BMI 41.35 kg/m    CC:  Chief Complaint  Patient presents with   Annual Exam    Part 2    HPI: Cassie Nelson is a 71 y.o. female presenting on 02/14/2021 for Annual Exam (Part 2)  The patient presents for annual medicare wellness, complete physical and review of chronic health problems. He/She also has the following acute concerns today: none   The patient saw a LPN or RN for medicare wellness visit.  Prevention and wellness was reviewed in detail. Note reviewed and important notes copied below.   She retired at the end of last year... has been less active, not doing weight watchers. She has now in the last month... she has lost 12 lbs.  She has restarted walking now. Getting back on weight watchers.  Hypertension:    Borderline control in office today on amlodipine 10 mg daily, lisinopril HCTZ 20/12.5 mg daily BP Readings from Last 3 Encounters:  02/14/21 (!) 146/60  10/16/19 110/62  12/17/18 (!) 134/54  Using medication without problems or lightheadedness:  none Chest pain with exertion: none Edema:none Short of breath:none Average home BPs: 121/69, this AM 131/70 Other issues:  Elevated Cholesterol:  Not at goal despite red yeast rice  1200 mg BID. Lab Results  Component Value Date   CHOL 190 02/07/2021   HDL 45.30 02/07/2021   LDLCALC 127 (H) 02/07/2021   LDLDIRECT 125.0 07/01/2017   TRIG 92.0 02/07/2021   CHOLHDL 4 02/07/2021  Using medications without problems: Muscle aches:  Diet compliance: moderate Exercise: has gotten back on track. Other complaints: The 10-year ASCVD risk score (Arnett DK, et al., 2019) is: 16.4%   Values used to calculate the score:     Age: 42 years     Sex: Female     Is Non-Hispanic  African American: No     Diabetic: No     Tobacco smoker: No     Systolic Blood Pressure: 712 mmHg     Is BP treated: Yes     HDL Cholesterol: 45.3 mg/dL     Total Cholesterol: 190 mg/dL  Wt Readings from Last 3 Encounters:  02/14/21 264 lb (119.7 kg)  12/14/20 225 lb (102.1 kg)  10/16/19 201 lb (91.2 kg)  Body mass index is 41.35 kg/m.    Prediabetes  Lab Results  Component Value Date   HGBA1C 5.4 02/07/2021         Relevant past medical, surgical, family and social history reviewed and updated as indicated. Interim medical history since our last visit reviewed. Allergies and medications reviewed and updated. Outpatient Medications Prior to Visit  Medication Sig Dispense Refill   amLODipine (NORVASC) 10 MG tablet TAKE 1 TABLET BY MOUTH EVERY DAY 90 tablet 0   amoxicillin (AMOXIL) 500 MG capsule Take 2,000 mg by mouth. 1 to 2 hours prior to dental work     CALCIUM-VITAMIN D PO Take 600 mg by mouth daily.      lisinopril-hydrochlorothiazide (ZESTORETIC) 20-12.5 MG tablet Take 1 tablet by mouth daily. 90 tablet 0   Multiple Vitamin (MULTIVITAMIN) tablet Take 1 tablet  by mouth daily. Centrum Silver     Multiple Vitamins-Minerals (HAIR VITAMINS) TABS Take 2 tablets by mouth at bedtime. Keranique KeraViatin Hair & Scalp Health Supplements or The Mane Choice Healthy Hair Vitamin     Red Yeast Rice 600 MG CAPS Take 1,200 mg by mouth 2 (two) times daily.      docusate sodium (COLACE) 100 MG capsule Take 1 capsule (100 mg total) by mouth 2 (two) times daily. 60 capsule 3   No facility-administered medications prior to visit.     Per HPI unless specifically indicated in ROS section below Review of Systems  Constitutional:  Negative for fatigue and fever.  HENT:  Negative for ear pain.   Eyes:  Negative for pain.  Respiratory:  Negative for chest tightness and shortness of breath.   Cardiovascular:  Negative for chest pain, palpitations and leg swelling.  Gastrointestinal:   Negative for abdominal pain.  Genitourinary:  Negative for dysuria.  Objective:  BP (!) 146/60    Pulse 69    Temp 98.4 F (36.9 C) (Temporal)    Ht 5\' 7"  (1.702 m)    Wt 264 lb (119.7 kg)    SpO2 97%    BMI 41.35 kg/m   Wt Readings from Last 3 Encounters:  02/14/21 264 lb (119.7 kg)  12/14/20 225 lb (102.1 kg)  10/16/19 201 lb (91.2 kg)      Physical Exam Constitutional:      General: She is not in acute distress.    Appearance: Normal appearance. She is well-developed. She is obese. She is not ill-appearing or toxic-appearing.  HENT:     Head: Normocephalic.     Right Ear: Hearing, tympanic membrane, ear canal and external ear normal. Tympanic membrane is not erythematous, retracted or bulging.     Left Ear: Hearing, tympanic membrane, ear canal and external ear normal. Tympanic membrane is not erythematous, retracted or bulging.     Nose: No mucosal edema or rhinorrhea.     Right Sinus: No maxillary sinus tenderness or frontal sinus tenderness.     Left Sinus: No maxillary sinus tenderness or frontal sinus tenderness.     Mouth/Throat:     Pharynx: Uvula midline.  Eyes:     General: Lids are normal. Lids are everted, no foreign bodies appreciated.     Conjunctiva/sclera: Conjunctivae normal.     Pupils: Pupils are equal, round, and reactive to light.  Neck:     Thyroid: No thyroid mass or thyromegaly.     Vascular: No carotid bruit.     Trachea: Trachea normal.  Cardiovascular:     Rate and Rhythm: Normal rate and regular rhythm.     Pulses: Normal pulses.     Heart sounds: Normal heart sounds, S1 normal and S2 normal. No murmur heard.   No friction rub. No gallop.  Pulmonary:     Effort: Pulmonary effort is normal. No tachypnea or respiratory distress.     Breath sounds: Normal breath sounds. No decreased breath sounds, wheezing, rhonchi or rales.  Abdominal:     General: Bowel sounds are normal.     Palpations: Abdomen is soft.     Tenderness: There is no abdominal  tenderness.  Musculoskeletal:     Cervical back: Normal range of motion and neck supple.  Skin:    General: Skin is warm and dry.     Findings: No rash.  Neurological:     Mental Status: She is alert.  Psychiatric:  Mood and Affect: Mood is not anxious or depressed.        Speech: Speech normal.        Behavior: Behavior normal. Behavior is cooperative.        Thought Content: Thought content normal.        Judgment: Judgment normal.      Results for orders placed or performed in visit on 02/07/21  Lipid panel  Result Value Ref Range   Cholesterol 190 0 - 200 mg/dL   Triglycerides 92.0 0.0 - 149.0 mg/dL   HDL 45.30 >39.00 mg/dL   VLDL 18.4 0.0 - 40.0 mg/dL   LDL Cholesterol 127 (H) 0 - 99 mg/dL   Total CHOL/HDL Ratio 4    NonHDL 145.10   Hemoglobin A1c  Result Value Ref Range   Hgb A1c MFr Bld 5.4 4.6 - 6.5 %  Comprehensive metabolic panel  Result Value Ref Range   Sodium 136 135 - 145 mEq/L   Potassium 4.3 3.5 - 5.1 mEq/L   Chloride 101 96 - 112 mEq/L   CO2 28 19 - 32 mEq/L   Glucose, Bld 102 (H) 70 - 99 mg/dL   BUN 21 6 - 23 mg/dL   Creatinine, Ser 0.83 0.40 - 1.20 mg/dL   Total Bilirubin 0.5 0.2 - 1.2 mg/dL   Alkaline Phosphatase 81 39 - 117 U/L   AST 28 0 - 37 U/L   ALT 22 0 - 35 U/L   Total Protein 8.2 6.0 - 8.3 g/dL   Albumin 4.4 3.5 - 5.2 g/dL   GFR 71.43 >60.00 mL/min   Calcium 10.2 8.4 - 10.5 mg/dL    This visit occurred during the SARS-CoV-2 public health emergency.  Safety protocols were in place, including screening questions prior to the visit, additional usage of staff PPE, and extensive cleaning of exam room while observing appropriate contact time as indicated for disinfecting solutions.   COVID 19 screen:  No recent travel or known exposure to COVID19 The patient denies respiratory symptoms of COVID 19 at this time. The importance of social distancing was discussed today.   Assessment and Plan   The patient's preventative maintenance and  recommended screening tests for an annual wellness exam were reviewed in full today. Brought up to date unless services declined.  Counselled on the importance of diet, exercise, and its role in overall health and mortality. The patient's FH and SH was reviewed, including their home life, tobacco status, and drug and alcohol status.   Vaccines: Uptodate with Tdap, shingles vaccine,PNA, flu shot.   Second shingrix due in 03/2021 Mammo:  05/2020  diagnostic pap/dve :  Pap not indicated. No DVE given no family history of ovarian uterine cancer Colon: No family history of colon cancer known.  Cologuard  01/2021 positive.. referred for colonoscopy  DEXA: 02/2019 normal.. Repeat in 5 years.  Hep C neg  Problem List Items Addressed This Visit     High cholesterol (Chronic)    10 year risk at 16%.Marland Kitchen statin indicated  She will restart lifestyle changes.. recheck in 3 months.. if not at goal .. consider medication to treat.      HTN (hypertension) (Chronic)    Stable, chronic.  Continue current medication.   Amlodipine 10 mg daily  lisinopril HCTZ  20/12.5 mg daily      Prediabetes (Chronic)     Improved control  Per A1C.      Morbid obesity ([place])    Encouraged exercise, weight loss, healthy eating habits.  Other Visit Diagnoses     Routine general medical examination at a health care facility    -  Primary        Eliezer Lofts, MD

## 2021-02-14 NOTE — Assessment & Plan Note (Signed)
Improved control  Per A1C.

## 2021-02-14 NOTE — Assessment & Plan Note (Signed)
Stable, chronic.  Continue current medication.   Amlodipine 10 mg daily  lisinopril HCTZ  20/12.5 mg daily 

## 2021-03-08 ENCOUNTER — Encounter
Admission: RE | Disposition: A | Discharge status: Home / Self Care | Payer: Self-pay | Source: Home / Self Care | Attending: Gastroenterology

## 2021-03-08 ENCOUNTER — Ambulatory Visit: Payer: Medicare HMO | Admitting: Certified Registered"

## 2021-03-08 ENCOUNTER — Ambulatory Visit
Admission: RE | Admit: 2021-03-08 | Discharge: 2021-03-08 | Disposition: A | Discharge status: Home / Self Care | Payer: Medicare HMO | Attending: Gastroenterology | Admitting: Gastroenterology

## 2021-03-08 DIAGNOSIS — D122 Benign neoplasm of ascending colon: Secondary | ICD-10-CM | POA: Insufficient documentation

## 2021-03-08 DIAGNOSIS — Z87891 Personal history of nicotine dependence: Secondary | ICD-10-CM | POA: Insufficient documentation

## 2021-03-08 DIAGNOSIS — K635 Polyp of colon: Secondary | ICD-10-CM

## 2021-03-08 DIAGNOSIS — Z96641 Presence of right artificial hip joint: Secondary | ICD-10-CM | POA: Insufficient documentation

## 2021-03-08 DIAGNOSIS — K5730 Diverticulosis of large intestine without perforation or abscess without bleeding: Secondary | ICD-10-CM | POA: Diagnosis not present

## 2021-03-08 DIAGNOSIS — Z1211 Encounter for screening for malignant neoplasm of colon: Secondary | ICD-10-CM | POA: Diagnosis not present

## 2021-03-08 DIAGNOSIS — I10 Essential (primary) hypertension: Secondary | ICD-10-CM | POA: Insufficient documentation

## 2021-03-08 DIAGNOSIS — E7800 Pure hypercholesterolemia, unspecified: Secondary | ICD-10-CM | POA: Diagnosis not present

## 2021-03-08 DIAGNOSIS — Z6841 Body Mass Index (BMI) 40.0 and over, adult: Secondary | ICD-10-CM | POA: Diagnosis not present

## 2021-03-08 DIAGNOSIS — D126 Benign neoplasm of colon, unspecified: Secondary | ICD-10-CM | POA: Diagnosis not present

## 2021-03-08 HISTORY — PX: COLONOSCOPY WITH PROPOFOL: SHX5780

## 2021-03-08 SURGERY — COLONOSCOPY WITH PROPOFOL
Anesthesia: General

## 2021-03-08 MED ORDER — EPHEDRINE SULFATE (PRESSORS) 50 MG/ML IJ SOLN
INTRAMUSCULAR | Status: DC | PRN
  Administered 2021-03-08: 10 mg via INTRAVENOUS

## 2021-03-08 MED ORDER — LIDOCAINE HCL (PF) 2 % IJ SOLN
INTRAMUSCULAR | Status: AC
  Filled 2021-03-08: qty 5

## 2021-03-08 MED ORDER — PROPOFOL 10 MG/ML IV BOLUS
INTRAVENOUS | Status: AC
  Filled 2021-03-08: qty 20

## 2021-03-08 MED ORDER — PROPOFOL 10 MG/ML IV BOLUS
INTRAVENOUS | Status: DC | PRN
  Administered 2021-03-08 (×2): 10 mg via INTRAVENOUS
  Administered 2021-03-08: 20 mg via INTRAVENOUS
  Administered 2021-03-08: 10 mg via INTRAVENOUS
  Administered 2021-03-08: 20 mg via INTRAVENOUS
  Administered 2021-03-08: 10 mg via INTRAVENOUS
  Administered 2021-03-08: 20 mg via INTRAVENOUS
  Administered 2021-03-08: 100 mg via INTRAVENOUS
  Administered 2021-03-08: 20 mg via INTRAVENOUS
  Administered 2021-03-08: 10 mg via INTRAVENOUS

## 2021-03-08 MED ORDER — SODIUM CHLORIDE 0.9 % IV SOLN
INTRAVENOUS | Status: DC
  Administered 2021-03-08: 20 mL/h via INTRAVENOUS

## 2021-03-08 MED ORDER — LIDOCAINE HCL (CARDIAC) PF 100 MG/5ML IV SOSY
PREFILLED_SYRINGE | INTRAVENOUS | Status: DC | PRN
Start: 2021-03-08 — End: 2021-03-08
  Administered 2021-03-08: 100 mg via INTRAVENOUS

## 2021-03-08 MED ORDER — EPHEDRINE 5 MG/ML INJ
INTRAVENOUS | Status: AC
  Filled 2021-03-08: qty 5

## 2021-03-08 MED ORDER — STERILE WATER FOR IRRIGATION IR SOLN
Status: DC | PRN
  Administered 2021-03-08: 60 mL

## 2021-03-08 NOTE — Anesthesia Postprocedure Evaluation (Signed)
Anesthesia Post Note  Patient: Kaydee Magel Whittenberg  Procedure(s) Performed: COLONOSCOPY WITH PROPOFOL  Patient location during evaluation: PACU Anesthesia Type: General Level of consciousness: awake and alert Pain management: pain level controlled Vital Signs Assessment: post-procedure vital signs reviewed and stable Respiratory status: spontaneous breathing, nonlabored ventilation, respiratory function stable and patient connected to nasal cannula oxygen Cardiovascular status: blood pressure returned to baseline and stable Postop Assessment: no apparent nausea or vomiting Anesthetic complications: no   No notable events documented.   Last Vitals:  Vitals:   03/08/21 0828 03/08/21 0835  BP: 96/62 128/65  Pulse: 62 (!) 56  Resp: 13 19  Temp:    SpO2: 100% 100%    Last Pain:  Vitals:   03/08/21 0835  TempSrc:   PainSc: 0-No pain                 Molli Barrows

## 2021-03-08 NOTE — Anesthesia Preprocedure Evaluation (Signed)
Anesthesia Evaluation  Patient identified by MRN, date of birth, ID band Patient awake    Reviewed: Allergy & Precautions, NPO status , Patient's Chart, lab work & pertinent test results  Airway Mallampati: III  TM Distance: >3 FB Neck ROM: full    Dental no notable dental hx.    Pulmonary neg pulmonary ROS, former smoker,    Pulmonary exam normal        Cardiovascular Exercise Tolerance: Good hypertension, Pt. on medications Normal cardiovascular exam     Neuro/Psych negative neurological ROS  negative psych ROS   GI/Hepatic negative GI ROS, Neg liver ROS,   Endo/Other  Morbid obesity  Renal/GU negative Renal ROS  negative genitourinary   Musculoskeletal   Abdominal (+) + obese,   Peds  Hematology negative hematology ROS (+)   Anesthesia Other Findings Past Medical History: No date: Arthritis No date: Diverticular disease 2009, 2010: Hx of diverticulitis of colon No date: Hypertension No date: Shortness of breath     Comment:  on exertion  Past Surgical History: 07/03/2012: [place]; N/A     Comment:  Procedure: DILATATION & CURETTAGE/HYSTEROSCOPY WITH               NOVASURE ABLATION;  Surgeon: Lavonia Drafts, MD;               Location: [facility];  Service: Gynecology;  Laterality: N/A; 1952: HERNIA REPAIR     Comment:  congenital, surgery at 70 months old No date: MOUTH SURGERY     Comment:  biopsy of cheek No date: TONSILLECTOMY No date: TONSILLECTOMY 12/17/2018: TOTAL HIP ARTHROPLASTY; Right     Comment:  Procedure: TOTAL HIP ARTHROPLASTY ANTERIOR APPROACH;                Surgeon: Rod Can, MD;  Location: [facility];                Service: Orthopedics;  Laterality: Right;  BMI    Body Mass Index: 40.12 kg/m      Reproductive/Obstetrics negative OB ROS                             Anesthesia Physical Anesthesia  Plan  ASA: 3  Anesthesia Plan: General   Post-op Pain Management:    Induction: Intravenous  PONV Risk Score and Plan: Propofol infusion and TIVA  Airway Management Planned: Natural Airway and Nasal Cannula  Additional Equipment:   Intra-op Plan:   Post-operative Plan:   Informed Consent: I have reviewed the patients History and Physical, chart, labs and discussed the procedure including the risks, benefits and alternatives for the proposed anesthesia with the patient or authorized representative who has indicated his/her understanding and acceptance.     Dental Advisory Given  Plan Discussed with: Anesthesiologist, CRNA and Surgeon  Anesthesia Plan Comments: (Patient consented for risks of anesthesia including but not limited to:  - adverse reactions to medications - risk of airway placement if required - damage to eyes, teeth, lips or other oral mucosa - nerve damage due to positioning  - sore throat or hoarseness - Damage to heart, brain, nerves, lungs, other parts of body or loss of life  Patient voiced understanding.)        Anesthesia Quick Evaluation

## 2021-03-08 NOTE — Transfer of Care (Signed)
Immediate Anesthesia Transfer of Care Note  Patient: Cassie Nelson  Procedure(s) Performed: COLONOSCOPY WITH PROPOFOL  Patient Location: PACU  Anesthesia Type:General  Level of Consciousness: awake  Airway & Oxygen Therapy: Patient Spontanous Breathing  Post-op Assessment: Report given to RN and Post -op Vital signs reviewed and stable  Post vital signs: Reviewed and stable  Last Vitals:  Vitals Value Taken Time  BP 112/44 03/08/21 0816  Temp 35.9   Pulse 66 03/08/21 0816  Resp 16 03/08/21 0816  SpO2 100 % 03/08/21 0816  Vitals shown include unvalidated device data.  Last Pain:  Vitals:   03/08/21 0815  TempSrc:   PainSc: 0-No pain         Complications: No notable events documented.

## 2021-03-08 NOTE — H&P (Signed)
Jonathon Bellows, MD [address] [place], [place], [place], [address] [address], [place], [place], [place], [address] Phone: [phone]  Fax: [phone]  Primary Care Physician:  Jinny Sanders, MD   Pre-Procedure History & Physical: HPI:  Cassie Nelson is a 71 y.o. female is here for an colonoscopy.   Past Medical History:  Diagnosis Date   Arthritis    Diverticular disease    Hx of diverticulitis of colon 2009, 2010   Hypertension    Shortness of breath    on exertion    Past Surgical History:  Procedure Laterality Date   DILITATION & CURRETTAGE/HYSTROSCOPY WITH NOVASURE ABLATION N/A 07/03/2012   Procedure: DILATATION & CURETTAGE/HYSTEROSCOPY WITH NOVASURE ABLATION;  Surgeon: Lavonia Drafts, MD;  Location: [facility];  Service: Gynecology;  Laterality: N/A;   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, surgery at 61 months old   MOUTH SURGERY     biopsy of cheek   TONSILLECTOMY     TONSILLECTOMY     TOTAL HIP ARTHROPLASTY Right 12/17/2018   Procedure: TOTAL HIP ARTHROPLASTY ANTERIOR APPROACH;  Surgeon: Rod Can, MD;  Location: [facility];  Service: Orthopedics;  Laterality: Right;    Prior to Admission medications   Medication Sig Start Date End Date Taking? Authorizing Provider  amLODipine (NORVASC) 10 MG tablet TAKE 1 TABLET BY MOUTH EVERY DAY 01/12/21  Yes Bedsole, Amy E, MD  amoxicillin (AMOXIL) 500 MG capsule Take 2,000 mg by mouth. 1 to 2 hours prior to dental work   Yes [provider]  CALCIUM-VITAMIN D PO Take 600 mg by mouth daily.    Yes [provider]  lisinopril-hydrochlorothiazide (ZESTORETIC) 20-12.5 MG tablet Take 1 tablet by mouth daily. 12/22/20  Yes Bedsole, Amy E, MD  Multiple Vitamin (MULTIVITAMIN) tablet Take 1 tablet by mouth daily. Centrum Silver   Yes [provider]  Multiple Vitamins-Minerals (HAIR VITAMINS) TABS Take 2 tablets by mouth at bedtime. Keranique KeraViatin Hair & Scalp Health Supplements or The  Mane Choice Healthy Hair Vitamin   Yes [provider]  Red Yeast Rice 600 MG CAPS Take 1,200 mg by mouth 2 (two) times daily.    Yes [provider]    Allergies as of 02/01/2021 - Review Complete 02/01/2021  Allergen Reaction Noted   Levofloxacin Other (See Comments) 03/09/2016    Family History  Problem Relation Age of Onset   Heart disease Father    Cancer Father 83       colon cancer   Arthritis Father    Hypertension Father    Hypothyroidism Father    Hypertension Mother    Hypothyroidism Mother    Breast cancer Neg Hx     Social History   Socioeconomic History   Marital status: Single    Spouse name: Not on file   Number of children: 0   Years of education: Not on file   Highest education level: Not on file  Occupational History   Occupation: Tourist information centre manager: [employer]  Tobacco Use   Smoking status: Former    Years: 25.00    Types: Cigarettes    Quit date: 06/22/2010    Years since quitting: 10.7   Smokeless tobacco: Never  Vaping Use   Vaping Use: Never used  Substance and Sexual Activity   Alcohol use: Yes    Alcohol/week: 1.0 standard drink    Types: 1 Standard drinks or equivalent per week  Comment: occasionally   Drug use: No   Sexual activity: Never  Other Topics Concern   Not on file  Social History Narrative   Single, No kids.   Works as a Orthoptist for [employer] Strain: Low Risk    Difficulty of Paying Living Expenses: Not hard at all  Food Insecurity: No Food Insecurity   Worried About Charity fundraiser in the Last Year: Never true   Arboriculturist in the Last Year: Never true  Transportation Needs: No Data processing manager (Medical): No   Lack of Transportation (Non-Medical): No  Physical Activity: Sufficiently Active   Days of Exercise per Week: 7 days   Minutes of Exercise per Session: 40 min  Stress: No  Stress Concern Present   Feeling of Stress : Not at all  Social Connections: Moderately Isolated   Frequency of Communication with Friends and Family: More than three times a week   Frequency of Social Gatherings with Friends and Family: Twice a week   Attends Religious Services: Never   Marine scientist or Organizations: Yes   Attends Music therapist: More than 4 times per year   Marital Status: Never married  Human resources officer Violence: Not At Risk   Fear of Current or Ex-Partner: No   Emotionally Abused: No   Physically Abused: No   Sexually Abused: No    Review of Systems: See HPI, otherwise negative ROS  Physical Exam: BP (!) 171/69    Pulse 72    Temp (!) 96.4 F (35.8 C) (Temporal)    Resp 20    Ht 5' 7.5" (1.715 m)    Wt 117.9 kg    SpO2 100%    BMI 40.12 kg/m  General:   Alert,  pleasant and cooperative in NAD Head:  Normocephalic and atraumatic. Neck:  Supple; no masses or thyromegaly. Lungs:  Clear throughout to auscultation, normal respiratory effort.    Heart:  +S1, +S2, Regular rate and rhythm, No edema. Abdomen:  Soft, nontender and nondistended. Normal bowel sounds, without guarding, and without rebound.   Neurologic:  Alert and  oriented x4;  grossly normal neurologically.  Impression/Plan: Cassie Nelson is here for an colonoscopy to be performed for Screening colonoscopy average risk   Risks, benefits, limitations, and alternatives regarding  colonoscopy have been reviewed with the patient.  Questions have been answered.  All parties agreeable.   Jonathon Bellows, MD  03/08/2021, 7:42 AM

## 2021-03-08 NOTE — Op Note (Signed)
[facility] Patient Name: Brittiany Wiehe Procedure Date: 03/08/2021 7:45 AM MRN: [id number] Account #: [id number] Date of Birth: 09-20-1950 Admit Type: Outpatient Age: 71 Room: [facility] ENDO ROOM 3 Gender: Female Note Status: Finalized Instrument Name: Jasper Riling [id number] Procedure:             Colonoscopy Indications:           Screening for colorectal malignant neoplasm Providers:             Jonathon Bellows MD, MD Referring MD:          Jinny Sanders MD, MD (Referring MD) Medicines:             Monitored Anesthesia Care Complications:         No immediate complications. Procedure:             Pre-Anesthesia Assessment:                        - Prior to the procedure, a History and Physical was                         performed, and patient medications, allergies and                         sensitivities were reviewed. The patient's tolerance                         of previous anesthesia was reviewed.                        - The risks and benefits of the procedure and the                         sedation options and risks were discussed with the                         patient. All questions were answered and informed                         consent was obtained.                        - ASA Grade Assessment: II - A patient with mild                         systemic disease.                        After obtaining informed consent, the colonoscope was                         passed under direct vision. Throughout the procedure,                         the patient's blood pressure, pulse, and oxygen                         saturations were monitored continuously. The                         Colonoscope  was introduced through the anus and                         advanced to the the cecum, identified by the                         appendiceal orifice. The colonoscopy was performed                         with ease. The patient tolerated the procedure well.                          The quality of the bowel preparation was good. Findings:      The perianal and digital rectal examinations were normal.      Two sessile polyps were found in the ascending colon and cecum. The       polyps were 3 to 4 mm in size. These polyps were removed with a jumbo       cold forceps. Resection and retrieval were complete.      A 7 mm polyp was found in the ascending colon. The polyp was sessile.       The polyp was removed with a cold snare. Resection and retrieval were       complete.      Multiple small-mouthed diverticula were found in the sigmoid colon.      The exam was otherwise without abnormality on direct and retroflexion       views. Impression:            - Two 3 to 4 mm polyps in the ascending colon and in                         the cecum, removed with a jumbo cold forceps. Resected                         and retrieved.                        - One 7 mm polyp in the ascending colon, removed with                         a cold snare. Resected and retrieved.                        - Diverticulosis in the sigmoid colon.                        - The examination was otherwise normal on direct and                         retroflexion views. Recommendation:        - Discharge patient to home (with escort).                        - Resume previous diet.                        - Continue present medications.                        -  Await pathology results.                        - Repeat colonoscopy for surveillance based on                         pathology results. Procedure Code(s):     --- Professional ---                        [phone], Colonoscopy, flexible; with removal of                         tumor(s), polyp(s), or other lesion(s) by snare                         technique                        45380, 81, Colonoscopy, flexible; with biopsy, single                         or multiple Diagnosis Code(s):     --- Professional ---                         K63.5, Polyp of colon                        Z12.11, Encounter for screening for malignant neoplasm                         of colon                        K57.30, Diverticulosis of large intestine without                         perforation or abscess without bleeding CPT copyright 2019 American Medical Association. All rights reserved. The codes documented in this report are preliminary and upon coder review may  be revised to meet current compliance requirements. Jonathon Bellows, MD Jonathon Bellows MD, MD 03/08/2021 8:13:04 AM This report has been signed electronically. Number of Addenda: 0 Note Initiated On: 03/08/2021 7:45 AM Scope Withdrawal Time: 0 hours 16 minutes 30 seconds  Total Procedure Duration: 0 hours 20 minutes 21 seconds  Estimated Blood Loss:  Estimated blood loss: none.      [facility]

## 2021-03-09 ENCOUNTER — Encounter: Payer: Self-pay | Admitting: Gastroenterology

## 2021-03-09 LAB — SURGICAL PATHOLOGY

## 2021-03-13 ENCOUNTER — Encounter: Payer: Self-pay | Admitting: Gastroenterology

## 2021-03-19 ENCOUNTER — Other Ambulatory Visit: Payer: Self-pay | Admitting: Family Medicine

## 2021-04-07 ENCOUNTER — Other Ambulatory Visit: Payer: Self-pay | Admitting: Family Medicine

## 2021-05-01 ENCOUNTER — Other Ambulatory Visit: Payer: Self-pay | Admitting: Family Medicine

## 2021-05-01 DIAGNOSIS — Z1231 Encounter for screening mammogram for malignant neoplasm of breast: Secondary | ICD-10-CM

## 2021-05-08 ENCOUNTER — Telehealth: Payer: Self-pay | Admitting: Family Medicine

## 2021-05-08 DIAGNOSIS — E7800 Pure hypercholesterolemia, unspecified: Secondary | ICD-10-CM

## 2021-05-08 DIAGNOSIS — R7303 Prediabetes: Secondary | ICD-10-CM

## 2021-05-08 NOTE — Telephone Encounter (Signed)
-----   Message from Velna Hatchet, RT sent at 05/01/2021 11:02 AM EDT ----- ?Regarding: Lab Thu 05/18/21 ?Lab orders needed for appt on 05/18/21, please.  Thanks, Anda Kraft ? ?

## 2021-05-18 ENCOUNTER — Other Ambulatory Visit (INDEPENDENT_AMBULATORY_CARE_PROVIDER_SITE_OTHER): Payer: Medicare HMO

## 2021-05-18 DIAGNOSIS — R7303 Prediabetes: Secondary | ICD-10-CM | POA: Diagnosis not present

## 2021-05-18 DIAGNOSIS — E7800 Pure hypercholesterolemia, unspecified: Secondary | ICD-10-CM | POA: Diagnosis not present

## 2021-05-18 LAB — COMPREHENSIVE METABOLIC PANEL
ALT: 24 U/L (ref 0–35)
AST: 24 U/L (ref 0–37)
Albumin: 3.9 g/dL (ref 3.5–5.2)
Alkaline Phosphatase: 91 U/L (ref 39–117)
BUN: 19 mg/dL (ref 6–23)
CO2: 29 mEq/L (ref 19–32)
Calcium: 9.3 mg/dL (ref 8.4–10.5)
Chloride: 104 mEq/L (ref 96–112)
Creatinine, Ser: 0.82 mg/dL (ref 0.40–1.20)
GFR: 72.33 mL/min (ref >60.00)
Glucose, Bld: 102 mg/dL — ABNORMAL HIGH (ref 70–99)
Potassium: 4.2 mEq/L (ref 3.5–5.1)
Sodium: 140 mEq/L (ref 135–145)
Total Bilirubin: 0.3 mg/dL (ref 0.2–1.2)
Total Protein: 7.5 g/dL (ref 6.0–8.3)

## 2021-05-18 LAB — LIPID PANEL
Cholesterol: 184 mg/dL (ref 0–200)
HDL: 48.6 mg/dL (ref >39.00)
LDL Cholesterol: 108 mg/dL — ABNORMAL HIGH (ref 0–99)
NonHDL: 135.2
Total CHOL/HDL Ratio: 4
Triglycerides: 135 mg/dL (ref 0.0–149.0)
VLDL: 27 mg/dL (ref 0.0–40.0)

## 2021-05-18 LAB — HEMOGLOBIN A1C: Hgb A1c MFr Bld: 5.6 % (ref 4.6–6.5)

## 2021-05-24 ENCOUNTER — Ambulatory Visit
Admission: RE | Admit: 2021-05-24 | Discharge: 2021-05-24 | Disposition: A | Discharge status: Home / Self Care | Payer: Medicare HMO | Source: Ambulatory Visit | Attending: Family Medicine | Admitting: Family Medicine

## 2021-05-24 DIAGNOSIS — Z1231 Encounter for screening mammogram for malignant neoplasm of breast: Secondary | ICD-10-CM | POA: Diagnosis not present

## 2021-05-25 DIAGNOSIS — H5213 Myopia, bilateral: Secondary | ICD-10-CM | POA: Diagnosis not present

## 2021-05-25 DIAGNOSIS — H0288B Meibomian gland dysfunction left eye, upper and lower eyelids: Secondary | ICD-10-CM | POA: Diagnosis not present

## 2021-05-25 DIAGNOSIS — H2513 Age-related nuclear cataract, bilateral: Secondary | ICD-10-CM | POA: Diagnosis not present

## 2021-05-25 DIAGNOSIS — H52223 Regular astigmatism, bilateral: Secondary | ICD-10-CM | POA: Diagnosis not present

## 2021-05-25 DIAGNOSIS — H0288A Meibomian gland dysfunction right eye, upper and lower eyelids: Secondary | ICD-10-CM | POA: Diagnosis not present

## 2021-05-25 DIAGNOSIS — H524 Presbyopia: Secondary | ICD-10-CM | POA: Diagnosis not present

## 2021-09-20 DIAGNOSIS — Z800 Family history of malignant neoplasm of digestive organs: Secondary | ICD-10-CM | POA: Diagnosis not present

## 2021-09-20 DIAGNOSIS — R32 Unspecified urinary incontinence: Secondary | ICD-10-CM | POA: Diagnosis not present

## 2021-09-20 DIAGNOSIS — Z96649 Presence of unspecified artificial hip joint: Secondary | ICD-10-CM | POA: Diagnosis not present

## 2021-09-20 DIAGNOSIS — I10 Essential (primary) hypertension: Secondary | ICD-10-CM | POA: Diagnosis not present

## 2021-09-20 DIAGNOSIS — Z881 Allergy status to other antibiotic agents status: Secondary | ICD-10-CM | POA: Diagnosis not present

## 2021-09-20 DIAGNOSIS — Z87891 Personal history of nicotine dependence: Secondary | ICD-10-CM | POA: Diagnosis not present

## 2021-09-20 DIAGNOSIS — Z6838 Body mass index (BMI) 38.0-38.9, adult: Secondary | ICD-10-CM | POA: Diagnosis not present

## 2021-12-15 ENCOUNTER — Ambulatory Visit (INDEPENDENT_AMBULATORY_CARE_PROVIDER_SITE_OTHER): Payer: Medicare HMO

## 2021-12-15 VITALS — Ht 67.5 in | Wt 260.0 lb

## 2021-12-15 DIAGNOSIS — Z0000 Encounter for general adult medical examination without abnormal findings: Secondary | ICD-10-CM

## 2021-12-15 NOTE — Patient Instructions (Addendum)
Cassie Nelson , Thank you for taking time to come for your Medicare Wellness Visit. I appreciate your ongoing commitment to your health goals. Please review the following plan we discussed and let me know if I can assist you in the future.   These are the goals we discussed:  Goals       Patient Stated     Weight loss goal 190lb (pt-stated)      Would like to get back on track with weight loss. Exercise more at [facility]        This is a list of the screening recommended for you and due dates:  Health Maintenance  Topic Date Due   COVID-19 Vaccine (6 - 2023-24 season) 12/31/2021*   DTaP/Tdap/Td vaccine (2 - Td or Tdap) 05/23/2022   Mammogram  05/25/2022   Medicare Annual Wellness Visit  12/16/2022   Cologuard (Stool DNA test)  01/18/2024   Pneumonia Vaccine  Completed   Flu Shot  Completed   DEXA scan (bone density measurement)  Completed   Hepatitis C Screening: USPSTF Recommendation to screen - Ages 65-79 yo.  Completed   Zoster (Shingles) Vaccine  Completed   HPV Vaccine  Aged Out  *Topic was postponed. The date shown is not the original due date.    Advanced directives: on file  Conditions/risks identified: none new  Next appointment: Follow up in one year for your annual wellness visit    Preventive Care 65 Years and Older, Female Preventive care refers to lifestyle choices and visits with your health care provider that can promote health and wellness. What does preventive care include? A yearly physical exam. This is also called an annual well check. Dental exams once or twice a year. Routine eye exams. Ask your health care provider how often you should have your eyes checked. Personal lifestyle choices, including: Daily care of your teeth and gums. Regular physical activity. Eating a healthy diet. Avoiding tobacco and drug use. Limiting alcohol use. Practicing safe sex. Taking low-dose aspirin every day. Taking vitamin and mineral supplements as recommended by your  health care provider. What happens during an annual well check? The services and screenings done by your health care provider during your annual well check will depend on your age, overall health, lifestyle risk factors, and family history of disease. Counseling  Your health care provider may ask you questions about your: Alcohol use. Tobacco use. Drug use. Emotional well-being. Home and relationship well-being. Sexual activity. Eating habits. History of falls. Memory and ability to understand (cognition). Work and work Statistician. Reproductive health. Screening  You may have the following tests or measurements: Height, weight, and BMI. Blood pressure. Lipid and cholesterol levels. These may be checked every 5 years, or more frequently if you are over 51 years old. Skin check. Lung cancer screening. You may have this screening every year starting at age 18 if you have a 30-pack-year history of smoking and currently smoke or have quit within the past 15 years. Fecal occult blood test (FOBT) of the stool. You may have this test every year starting at age 20. Flexible sigmoidoscopy or colonoscopy. You may have a sigmoidoscopy every 5 years or a colonoscopy every 10 years starting at age 53. Hepatitis C blood test. Hepatitis B blood test. Sexually transmitted disease (STD) testing. Diabetes screening. This is done by checking your blood sugar (glucose) after you have not eaten for a while (fasting). You may have this done every 1-3 years. Bone density scan. This is done  to screen for osteoporosis. You may have this done starting at age 15. Mammogram. This may be done every 1-2 years. Talk to your health care provider about how often you should have regular mammograms. Talk with your health care provider about your test results, treatment options, and if necessary, the need for more tests. Vaccines  Your health care provider may recommend certain vaccines, such as: Influenza vaccine.  This is recommended every year. Tetanus, diphtheria, and acellular pertussis (Tdap, Td) vaccine. You may need a Td booster every 10 years. Zoster vaccine. You may need this after age 20. Pneumococcal 13-valent conjugate (PCV13) vaccine. One dose is recommended after age 7. Pneumococcal polysaccharide (PPSV23) vaccine. One dose is recommended after age 39. Talk to your health care provider about which screenings and vaccines you need and how often you need them. This information is not intended to replace advice given to you by your health care provider. Make sure you discuss any questions you have with your health care provider. Document Released: 01/28/2015 Document Revised: 09/21/2015 Document Reviewed: 11/02/2014 Elsevier Interactive Patient Education  2017 [place] Prevention in the Home Falls can cause injuries. They can happen to people of all ages. There are many things you can do to make your home safe and to help prevent falls. What can I do on the outside of my home? Regularly fix the edges of walkways and driveways and fix any cracks. Remove anything that might make you trip as you walk through a door, such as a raised step or threshold. Trim any bushes or trees on the path to your home. Use bright outdoor lighting. Clear any walking paths of anything that might make someone trip, such as rocks or tools. Regularly check to see if handrails are loose or broken. Make sure that both sides of any steps have handrails. Any raised decks and porches should have guardrails on the edges. Have any leaves, snow, or ice cleared regularly. Use sand or salt on walking paths during winter. Clean up any spills in your garage right away. This includes oil or grease spills. What can I do in the bathroom? Use night lights. Install grab bars by the toilet and in the tub and shower. Do not use towel bars as grab bars. Use non-skid mats or decals in the tub or shower. If you need to sit  down in the shower, use a plastic, non-slip stool. Keep the floor dry. Clean up any water that spills on the floor as soon as it happens. Remove soap buildup in the tub or shower regularly. Attach bath mats securely with double-sided non-slip rug tape. Do not have throw rugs and other things on the floor that can make you trip. What can I do in the bedroom? Use night lights. Make sure that you have a light by your bed that is easy to reach. Do not use any sheets or blankets that are too big for your bed. They should not hang down onto the floor. Have a firm chair that has side arms. You can use this for support while you get dressed. Do not have throw rugs and other things on the floor that can make you trip. What can I do in the kitchen? Clean up any spills right away. Avoid walking on wet floors. Keep items that you use a lot in easy-to-reach places. If you need to reach something above you, use a strong step stool that has a grab bar. Keep electrical cords out of the  way. Do not use floor polish or wax that makes floors slippery. If you must use wax, use non-skid floor wax. Do not have throw rugs and other things on the floor that can make you trip. What can I do with my stairs? Do not leave any items on the stairs. Make sure that there are handrails on both sides of the stairs and use them. Fix handrails that are broken or loose. Make sure that handrails are as long as the stairways. Check any carpeting to make sure that it is firmly attached to the stairs. Fix any carpet that is loose or worn. Avoid having throw rugs at the top or bottom of the stairs. If you do have throw rugs, attach them to the floor with carpet tape. Make sure that you have a light switch at the top of the stairs and the bottom of the stairs. If you do not have them, ask someone to add them for you. What else can I do to help prevent falls? Wear shoes that: Do not have high heels. Have rubber bottoms. Are  comfortable and fit you well. Are closed at the toe. Do not wear sandals. If you use a stepladder: Make sure that it is fully opened. Do not climb a closed stepladder. Make sure that both sides of the stepladder are locked into place. Ask someone to hold it for you, if possible. Clearly mark and make sure that you can see: Any grab bars or handrails. First and last steps. Where the edge of each step is. Use tools that help you move around (mobility aids) if they are needed. These include: Canes. Walkers. Scooters. Crutches. Turn on the lights when you go into a dark area. Replace any light bulbs as soon as they burn out. Set up your furniture so you have a clear path. Avoid moving your furniture around. If any of your floors are uneven, fix them. If there are any pets around you, be aware of where they are. Review your medicines with your doctor. Some medicines can make you feel dizzy. This can increase your chance of falling. Ask your doctor what other things that you can do to help prevent falls. This information is not intended to replace advice given to you by your health care provider. Make sure you discuss any questions you have with your health care provider. Document Released: 10/28/2008 Document Revised: 06/09/2015 Document Reviewed: 02/05/2014 Elsevier Interactive Patient Education  2017 [employer].

## 2021-12-15 NOTE — Progress Notes (Signed)
Subjective:   Cassie Nelson is a 71 y.o. female who presents for Medicare Annual (Subsequent) preventive examination.  Review of Systems    No ROS.  Medicare Wellness Virtual Visit.  Visual/audio telehealth visit, UTA vital signs.   See social history for additional risk factors.   Cardiac Risk Factors include: advanced age (>41mn, >>88women);hypertension     Objective:    Today's Vitals   12/15/21 1032  Weight: 260 lb (117.9 kg)  Height: 5' 7.5" (1.715 m)   Body mass index is 40.12 kg/m.     12/15/2021   10:40 AM 03/08/2021    6:54 AM 12/14/2020   11:19 AM 12/17/2018    6:42 AM 12/15/2018    9:35 AM 07/03/2012    8:31 AM  Advanced Directives  Does Patient Have a Medical Advance Directive? Yes Yes Yes Yes Yes Patient does not have advance directive  Type of Advance Directive [place] will Living will [place]Living will [place]Living will [place] will   Does patient want to make changes to medical advance directive? No - Patient declined  Yes (MAU/Ambulatory/Procedural Areas - Information given)  No - Patient declined   Copy of HAccidentin Chart? Yes - validated most recent copy scanned in chart (See row information)  Yes - validated most recent copy scanned in chart (See row information) Yes - validated most recent copy scanned in chart (See row information) No - copy requested     Current Medications (verified) Outpatient Encounter Medications as of 12/15/2021  Medication Sig   amLODipine (NORVASC) 10 MG tablet TAKE 1 TABLET BY MOUTH EVERY DAY   amoxicillin (AMOXIL) 500 MG capsule Take 2,000 mg by mouth. 1 to 2 hours prior to dental work   CALCIUM-VITAMIN D PO Take 600 mg by mouth daily.    lisinopril-hydrochlorothiazide (ZESTORETIC) 20-12.5 MG tablet TAKE 1 TABLET BY MOUTH EVERY DAY   Multiple Vitamin (MULTIVITAMIN) tablet Take 1 tablet by mouth  daily. Centrum Silver   Multiple Vitamins-Minerals (HAIR VITAMINS) TABS Take 2 tablets by mouth at bedtime. Keranique KeraViatin Hair & Scalp Health Supplements or [employer] Healthy Hair Vitamin   Red Yeast Rice 600 MG CAPS Take 1,200 mg by mouth 2 (two) times daily.    No facility-administered encounter medications on file as of 12/15/2021.    Allergies (verified) Levofloxacin   History: Past Medical History:  Diagnosis Date   Arthritis    Diverticular disease    Hx of diverticulitis of colon 2009, 2010   Hypertension    Shortness of breath    on exertion   Past Surgical History:  Procedure Laterality Date   COLONOSCOPY WITH PROPOFOL N/A 03/08/2021   Procedure: COLONOSCOPY WITH PROPOFOL;  Surgeon: AJonathon Bellows MD;  [facility];  Service: Gastroenterology;  Laterality: N/A;   DILITATION & CURRETTAGE/HYSTROSCOPY WITH NOVASURE ABLATION N/A 07/03/2012   Procedure: DILATATION & CURETTAGE/HYSTEROSCOPY WITH NOVASURE ABLATION;  Surgeon: CLavonia Drafts MD;  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, surgery at 345 monthsold   MOUTH SURGERY     biopsy of cheek   TONSILLECTOMY     TONSILLECTOMY     TOTAL HIP ARTHROPLASTY Right 12/17/2018   Procedure: TOTAL HIP ARTHROPLASTY ANTERIOR APPROACH;  Surgeon: SRod Can MD;  Location: [facility];  Service: Orthopedics;  Laterality: Right;   Family History  Problem Relation Age  of Onset   Heart disease Father    Cancer Father 24       colon cancer   Arthritis Father    Hypertension Father    Hypothyroidism Father    Hypertension Mother    Hypothyroidism Mother    Breast cancer Neg Hx    Social History   Socioeconomic History   Marital status: Single    Spouse name: Not on file   Number of children: 0   Years of education: Not on file   Highest education level: Not on file  Occupational History   Occupation: Press photographer rep    Employer: [employer]  Tobacco  Use   Smoking status: Former    Years: 25.00    Types: Cigarettes    Quit date: 06/22/2010    Years since quitting: 11.4   Smokeless tobacco: Never  Vaping Use   Vaping Use: Never used  Substance and Sexual Activity   Alcohol use: Yes    Alcohol/week: 1.0 standard drink of alcohol    Types: 1 Standard drinks or equivalent per week    Comment: occasionally   Drug use: No   Sexual activity: Never  Other Topics Concern   Not on file  Social History Narrative   Single, No kids.   Works as a Orthoptist for [employer] Strain: Low Risk  (12/15/2021)   Overall Financial Resource Strain (CARDIA)    Difficulty of Paying Living Expenses: Not hard at all  Food Insecurity: No Food Insecurity (12/15/2021)   Hunger Vital Sign    Worried About Running Out of Food in the Last Year: Never true    [place] in the Last Year: Never true  Transportation Needs: No Transportation Needs (12/15/2021)   PRAPARE - Hydrologist (Medical): No    Lack of Transportation (Non-Medical): No  Physical Activity: Insufficiently Active (12/15/2021)   Exercise Vital Sign    Days of Exercise per Week: 3 days    Minutes of Exercise per Session: 30 min  Stress: No Stress Concern Present (12/15/2021)   [place]    Feeling of Stress : Not at all  Social Connections: Unknown (12/15/2021)   Social Connection and Isolation Panel [NHANES]    Frequency of Communication with Friends and Family: More than three times a week    Frequency of Social Gatherings with Friends and Family: More than three times a week    Attends Religious Services: Not on Advertising copywriter or Organizations: No    Attends Archivist Meetings: Patient refused    Marital Status: Never married    Tobacco Counseling Counseling given: Not Answered   Clinical  Intake:  Pre-visit preparation completed: Yes        Diabetes: No  How often do you need to have someone help you when you read instructions, pamphlets, or other written materials from your doctor or pharmacy?: 1 - Never   Interpreter Needed?: No      Activities of Daily Living    12/15/2021   10:41 AM 12/14/2021   12:37 PM  In your present state of health, do you have any difficulty performing the following activities:  Hearing? 0 0  Vision? 0 0  Difficulty concentrating or making decisions? 0 0  Walking or climbing stairs? 0 0  Dressing or bathing?  0 0  Doing errands, shopping? 0 0  Preparing Food and eating ? N N  Using the Toilet? N N  In the past six months, have you accidently leaked urine? N Y  Do you have problems with loss of bowel control? N N  Managing your Medications? N N  Managing your Finances? N N  Housekeeping or managing your Housekeeping? N N    Patient Care Team: Jinny Sanders, MD as PCP - General (Family Medicine)  Indicate any recent Medical Services you may have received from other than [facility] providers in the past year (date may be approximate).     Assessment:   This is a routine wellness examination for Reha.  I connected with  Billy Rocco Cosgriff on 12/15/21 by a audio enabled telemedicine application and verified that I am speaking with the correct person using two identifiers.  Patient Location: Home  Provider Location: Office/Clinic  I discussed the limitations of evaluation and management by telemedicine. The patient expressed understanding and agreed to proceed.   Hearing/Vision screen Hearing Screening - Comments:: Patient is able to hear conversational tones without difficulty.  No issues reported.   Vision Screening - Comments:: Last exam 04/2021, Dr. Rick Duff, Wears glasses   Dietary issues and exercise activities discussed: Current Exercise Habits: Home exercise routine, Type of exercise: walking, Time (Minutes): 30,  Frequency (Times/Week): 3, Weekly Exercise (Minutes/Week): 90, Intensity: Mild Weight Watchers   Goals Addressed               This Visit's Progress     Patient Stated     Weight loss goal 190lb (pt-stated)   On track     Would like to get back on track with weight loss. Exercise more at [place]    12/15/2021   10:37 AM 12/14/2020   11:21 AM 10/16/2019   10:23 AM 09/23/2018   10:11 AM 07/12/2017   11:12 AM 03/09/2016    9:04 AM  PHQ 2/9 Scores  PHQ - 2 Score 0 0 0 0 0 0    Fall Risk    12/15/2021   10:41 AM 12/14/2021   12:37 PM 12/14/2020   11:20 AM 10/16/2019   10:23 AM 09/23/2018   10:11 AM  Fall Risk   Falls in the past year? 0 0 0 0 0  Number falls in past yr:  0 0    Injury with Fall?  0 0    Risk for fall due to : No Fall Risks  No Fall Risks    Follow up Falls evaluation completed;Falls prevention discussed  Falls prevention discussed      FALL RISK PREVENTION PERTAINING TO THE HOME: Home free of loose throw rugs in walkways, pet beds, electrical cords, etc? Yes  Adequate lighting in your home to reduce risk of falls? Yes   ASSISTIVE DEVICES UTILIZED TO PREVENT FALLS: Life alert? No  Use of a cane, walker or w/c? No  Grab bars in the bathroom? No  Shower chair or bench in shower? No  Elevated toilet seat or a handicapped toilet? No   TIMED UP AND GO: Was the test performed? No .   Cognitive Function:        12/15/2021   10:49 AM  6CIT Screen  What Year? 0 points  What month? 0 points  What time? 0 points  Count back from 20 0 points  Months in reverse 0 points  Repeat phrase 0  points  Total Score 0 points    Immunizations Immunization History  Administered Date(s) Administered   Fluad Quad(high Dose 65+) 10/28/2018, 10/16/2019, 10/18/2020   Influenza, High Dose Seasonal PF 10/20/2021   Influenza,inj,Quad PF,6+ Mos 10/20/2013, 11/11/2014, 11/07/2015, 11/07/2016, 10/09/2017   PFIZER(Purple Top)SARS-COV-2 Vaccination  02/28/2019, 03/25/2019, 10/31/2019   Pfizer Covid-19 Vaccine Bivalent Booster 3yr & up 10/18/2020, 06/02/2021   Pneumococcal Conjugate-13 03/09/2016   Pneumococcal Polysaccharide-23 07/12/2017   Respiratory Syncytial Virus Vaccine,Recomb Aduvanted(Arexvy) 10/20/2021   Tdap 05/22/2012   Zoster Recombinat (Shingrix) 02/08/2021, 05/26/2021   Zoster, Live 08/04/2013   Screening Tests Health Maintenance  Topic Date Due   COVID-19 Vaccine (6 - 2023-24 season) 12/31/2021 (Originally 09/15/2021)   DTaP/Tdap/Td (2 - Td or Tdap) 05/23/2022   MAMMOGRAM  05/25/2022   Medicare Annual Wellness (AWV)  12/16/2022   Fecal DNA (Cologuard)  01/18/2024   Pneumonia Vaccine 71 Years old  Completed   INFLUENZA VACCINE  Completed   DEXA SCAN  Completed   Hepatitis C Screening  Completed   Zoster Vaccines- Shingrix  Completed   HPV VACCINES  Aged Out   Health Maintenance There are no preventive care reminders to display for this patient.  Lung Cancer Screening: (Low Dose CT Chest recommended if Age 71-80years, 30 pack-year currently smoking OR have quit w/in 15years.) does not qualify.   Hepatitis C Screening: Completed 09/23/18.  Vision Screening: Recommended annual ophthalmology exams for early detection of glaucoma and other disorders of the eye.  Dental Screening: Recommended annual dental exams for proper oral hygiene  Community Resource Referral / Chronic Care Management: CRR required this visit?  No   CCM required this visit?  No      Plan:     I have personally reviewed and noted the following in the patient's chart:   Medical and social history Use of alcohol, tobacco or illicit drugs  Current medications and supplements including opioid prescriptions. Patient is not currently taking opioid prescriptions. Functional ability and status Nutritional status Physical activity Advanced directives List of other physicians Hospitalizations, surgeries, and ER visits in previous 12  months Vitals Screenings to include cognitive, depression, and falls Referrals and appointments  In addition, I have reviewed and discussed with patient certain preventive protocols, quality metrics, and best practice recommendations. A written personalized care plan for preventive services as well as general preventive health recommendations were provided to patient.     DLeta Jungling LPN   [id number]

## 2022-02-01 ENCOUNTER — Telehealth: Payer: Self-pay | Admitting: Family Medicine

## 2022-02-01 DIAGNOSIS — E7800 Pure hypercholesterolemia, unspecified: Secondary | ICD-10-CM

## 2022-02-01 DIAGNOSIS — R7303 Prediabetes: Secondary | ICD-10-CM

## 2022-02-01 NOTE — Telephone Encounter (Signed)
-----  Message from Velna Hatchet, RT sent at 01/30/2022 10:30 AM EST ----- Regarding: Wed 1/31 lab Patient is scheduled for cpx, please order future labs.  Thanks, Anda Kraft

## 2022-02-14 ENCOUNTER — Other Ambulatory Visit (INDEPENDENT_AMBULATORY_CARE_PROVIDER_SITE_OTHER): Payer: Medicare HMO

## 2022-02-14 DIAGNOSIS — R7303 Prediabetes: Secondary | ICD-10-CM

## 2022-02-14 DIAGNOSIS — E7800 Pure hypercholesterolemia, unspecified: Secondary | ICD-10-CM

## 2022-02-14 LAB — COMPREHENSIVE METABOLIC PANEL
ALT: 21 U/L (ref 0–35)
AST: 24 U/L (ref 0–37)
Albumin: 4.1 g/dL (ref 3.5–5.2)
Alkaline Phosphatase: 80 U/L (ref 39–117)
BUN: 17 mg/dL (ref 6–23)
CO2: 27 mEq/L (ref 19–32)
Calcium: 9.5 mg/dL (ref 8.4–10.5)
Chloride: 102 mEq/L (ref 96–112)
Creatinine, Ser: 0.89 mg/dL (ref 0.40–1.20)
GFR: 65.22 mL/min (ref >60.00)
Glucose, Bld: 100 mg/dL — ABNORMAL HIGH (ref 70–99)
Potassium: 4.1 mEq/L (ref 3.5–5.1)
Sodium: 136 mEq/L (ref 135–145)
Total Bilirubin: 0.4 mg/dL (ref 0.2–1.2)
Total Protein: 7.8 g/dL (ref 6.0–8.3)

## 2022-02-14 LAB — LIPID PANEL
Cholesterol: 185 mg/dL (ref 0–200)
HDL: 41.3 mg/dL (ref >39.00)
LDL Cholesterol: 119 mg/dL — ABNORMAL HIGH (ref 0–99)
NonHDL: 143.34
Total CHOL/HDL Ratio: 4
Triglycerides: 122 mg/dL (ref 0.0–149.0)
VLDL: 24.4 mg/dL (ref 0.0–40.0)

## 2022-02-14 LAB — HEMOGLOBIN A1C: Hgb A1c MFr Bld: 5.6 % (ref 4.6–6.5)

## 2022-02-15 NOTE — Progress Notes (Signed)
No critical labs need to be addressed urgently. We will discuss labs in detail at upcoming office visit.   

## 2022-02-21 ENCOUNTER — Encounter: Payer: Self-pay | Admitting: Family Medicine

## 2022-02-21 ENCOUNTER — Ambulatory Visit (INDEPENDENT_AMBULATORY_CARE_PROVIDER_SITE_OTHER): Payer: Medicare HMO | Admitting: Family Medicine

## 2022-02-21 VITALS — BP 138/60 | HR 72 | Temp 97.3°F | Ht 67.5 in | Wt 269.0 lb

## 2022-02-21 DIAGNOSIS — I10 Essential (primary) hypertension: Secondary | ICD-10-CM | POA: Diagnosis not present

## 2022-02-21 DIAGNOSIS — R7303 Prediabetes: Secondary | ICD-10-CM | POA: Diagnosis not present

## 2022-02-21 DIAGNOSIS — Z0000 Encounter for general adult medical examination without abnormal findings: Secondary | ICD-10-CM | POA: Diagnosis not present

## 2022-02-21 DIAGNOSIS — E7800 Pure hypercholesterolemia, unspecified: Secondary | ICD-10-CM | POA: Diagnosis not present

## 2022-02-21 NOTE — Assessment & Plan Note (Signed)
Stable, chronic.  Continue current medication.   Amlodipine 10 mg daily  lisinopril HCTZ  20/12.5 mg daily

## 2022-02-21 NOTE — Assessment & Plan Note (Signed)
Chronic stable control with lifestyle.

## 2022-02-21 NOTE — Assessment & Plan Note (Signed)
Encouraged exercise, weight loss, healthy eating habits. ? ?

## 2022-02-21 NOTE — Progress Notes (Signed)
Patient ID: Cassie Nelson, female    DOB: [birth year], 72 y.o.   MRN: [id number]  This visit was conducted in person.  BP 138/60 (BP Location: Left Arm, Patient Position: Sitting, Cuff Size: Large)   Pulse 72   Temp (!) 97.3 F (36.3 C) (Temporal)   Ht 5' 7.5" (1.715 m)   Wt 269 lb (122 kg)   SpO2 97%   BMI 41.51 kg/m    CC:  Chief Complaint  Patient presents with   Medicare Wellness    Part 2    HPI: Cassie Nelson is a 72 y.o. female presenting on 02/21/2022 for Medicare Wellness (Part 2)  The patient presents for  complete physical and review of chronic health problems. He/She also has the following acute concerns today: She has had a runny nose and head congestion since Monday, day 2 of illness.  She had a negative COVID test.  She is using Mucinex and cold and flu medication.  No fever, no known sick contacts. Minimal cough.   The patient saw a LPN or RN for medicare wellness visit.  Prevention and wellness was reviewed in detail. Note reviewed and important notes copied below.  Hypertension:    Good control in office today on amlodipine 10 mg daily, lisinopril HCTZ 20/12.5 mg daily BP Readings from Last 3 Encounters:  02/21/22 138/60  03/08/21 128/65  02/14/21 131/70  Using medication without problems or lightheadedness:  none Chest pain with exertion: none Edema:none Short of breath:none Average home BPs:  Other issues:  Elevated Cholesterol:  Not at goal despite red yeast rice  1200 mg BID. Lab Results  Component Value Date   CHOL 185 02/14/2022   HDL 41.30 02/14/2022   LDLCALC 119 (H) 02/14/2022   LDLDIRECT 125.0 07/01/2017   TRIG 122.0 02/14/2022   CHOLHDL 4 02/14/2022  Using medications without problems: Muscle aches:  Diet compliance: she is focusing more now on healthy eating Exercise: going to gym 3-4 times a week, [employer]  weights, has personal trainer  Trying to get back to regular walking. Other complaints: The 10-year ASCVD risk score  (Arnett DK, et al., 2019) is: 16.4%   Values used to calculate the score:     Age: 32 years     Sex: Female     Is Non-Hispanic African American: No     Diabetic: No     Tobacco smoker: No     Systolic Blood Pressure: 413 mmHg     Is BP treated: Yes     HDL Cholesterol: 41.3 mg/dL     Total Cholesterol: 185 mg/dL  Has lost 8 lbs in last month. Wt Readings from Last 3 Encounters:  02/21/22 269 lb (122 kg)  12/15/21 260 lb (117.9 kg)  03/08/21 260 lb (117.9 kg)  Body mass index is 41.51 kg/m.    Prediabetes  Lab Results  Component Value Date   HGBA1C 5.6 02/14/2022         Relevant past medical, surgical, family and social history reviewed and updated as indicated. Interim medical history since our last visit reviewed. Allergies and medications reviewed and updated. Outpatient Medications Prior to Visit  Medication Sig Dispense Refill   amLODipine (NORVASC) 10 MG tablet TAKE 1 TABLET BY MOUTH EVERY DAY 90 tablet 3   amoxicillin (AMOXIL) 500 MG capsule Take 2,000 mg by mouth. 1 to 2 hours prior to dental work     CALCIUM-VITAMIN D PO Take 600 mg by mouth daily.  lisinopril-hydrochlorothiazide (ZESTORETIC) 20-12.5 MG tablet TAKE 1 TABLET BY MOUTH EVERY DAY 90 tablet 3   Multiple Vitamin (MULTIVITAMIN) tablet Take 1 tablet by mouth daily. Centrum Silver     Multiple Vitamins-Minerals (HAIR VITAMINS) TABS Take 2 tablets by mouth at bedtime. Keranique KeraViatin Hair & Scalp Health Supplements or The [employer] Healthy Hair Vitamin     Red Yeast Rice 600 MG CAPS Take 1,200 mg by mouth 2 (two) times daily.      No facility-administered medications prior to visit.     Per HPI unless specifically indicated in ROS section below Review of Systems  Constitutional:  Negative for fatigue and fever.  HENT:  Negative for ear pain.   Eyes:  Negative for pain.  Respiratory:  Negative for chest tightness and shortness of breath.   Cardiovascular:  Negative for chest pain,  palpitations and leg swelling.  Gastrointestinal:  Negative for abdominal pain.  Genitourinary:  Negative for dysuria.   Objective:  BP 138/60 (BP Location: Left Arm, Patient Position: Sitting, Cuff Size: Large)   Pulse 72   Temp (!) 97.3 F (36.3 C) (Temporal)   Ht 5' 7.5" (1.715 m)   Wt 269 lb (122 kg)   SpO2 97%   BMI 41.51 kg/m   Wt Readings from Last 3 Encounters:  02/21/22 269 lb (122 kg)  12/15/21 260 lb (117.9 kg)  03/08/21 260 lb (117.9 kg)      Physical Exam Constitutional:      General: She is not in acute distress.    Appearance: Normal appearance. She is well-developed. She is obese. She is not ill-appearing or toxic-appearing.  HENT:     Head: Normocephalic.     Right Ear: Hearing, tympanic membrane, ear canal and external ear normal. Tympanic membrane is not erythematous, retracted or bulging.     Left Ear: Hearing, tympanic membrane, ear canal and external ear normal. Tympanic membrane is not erythematous, retracted or bulging.     Nose: No mucosal edema or rhinorrhea.     Right Sinus: No maxillary sinus tenderness or frontal sinus tenderness.     Left Sinus: No maxillary sinus tenderness or frontal sinus tenderness.     Mouth/Throat:     Pharynx: Uvula midline.  Eyes:     General: Lids are normal. Lids are everted, no foreign bodies appreciated.     Conjunctiva/sclera: Conjunctivae normal.     Pupils: Pupils are equal, round, and reactive to light.  Neck:     Thyroid: No thyroid mass or thyromegaly.     Vascular: No carotid bruit.     Trachea: Trachea normal.  Cardiovascular:     Rate and Rhythm: Normal rate and regular rhythm.     Pulses: Normal pulses.     Heart sounds: Normal heart sounds, S1 normal and S2 normal. No murmur heard.    No friction rub. No gallop.  Pulmonary:     Effort: Pulmonary effort is normal. No tachypnea or respiratory distress.     Breath sounds: Normal breath sounds. No decreased breath sounds, wheezing, rhonchi or rales.   Abdominal:     General: Bowel sounds are normal.     Palpations: Abdomen is soft.     Tenderness: There is no abdominal tenderness.  Musculoskeletal:     Cervical back: Normal range of motion and neck supple.  Skin:    General: Skin is warm and dry.     Findings: No rash.  Neurological:     Mental Status: She is  alert.  Psychiatric:        Mood and Affect: Mood is not anxious or depressed.        Speech: Speech normal.        Behavior: Behavior normal. Behavior is cooperative.        Thought Content: Thought content normal.        Judgment: Judgment normal.       Results for orders placed or performed in visit on 02/14/22  Comprehensive metabolic panel  Result Value Ref Range   Sodium 136 135 - 145 mEq/L   Potassium 4.1 3.5 - 5.1 mEq/L   Chloride 102 96 - 112 mEq/L   CO2 27 19 - 32 mEq/L   Glucose, Bld 100 (H) 70 - 99 mg/dL   BUN 17 6 - 23 mg/dL   Creatinine, Ser 0.89 0.40 - 1.20 mg/dL   Total Bilirubin 0.4 0.2 - 1.2 mg/dL   Alkaline Phosphatase 80 39 - 117 U/L   AST 24 0 - 37 U/L   ALT 21 0 - 35 U/L   Total Protein 7.8 6.0 - 8.3 g/dL   Albumin 4.1 3.5 - 5.2 g/dL   GFR 65.22 >60.00 mL/min   Calcium 9.5 8.4 - 10.5 mg/dL  Lipid panel  Result Value Ref Range   Cholesterol 185 0 - 200 mg/dL   Triglycerides 122.0 0.0 - 149.0 mg/dL   HDL 41.30 >39.00 mg/dL   VLDL 24.4 0.0 - 40.0 mg/dL   LDL Cholesterol 119 (H) 0 - 99 mg/dL   Total CHOL/HDL Ratio 4    NonHDL 143.34   Hemoglobin A1c  Result Value Ref Range   Hgb A1c MFr Bld 5.6 4.6 - 6.5 %    This visit occurred during the SARS-CoV-2 public health emergency.  Safety protocols were in place, including screening questions prior to the visit, additional usage of staff PPE, and extensive cleaning of exam room while observing appropriate contact time as indicated for disinfecting solutions.   COVID 19 screen:  No recent travel or known exposure to COVID19 The patient denies respiratory symptoms of COVID 19 at this  time. The importance of social distancing was discussed today.   Assessment and Plan   The patient's preventative maintenance and recommended screening tests for an annual wellness exam were reviewed in full today. Brought up to date unless services declined.  Counselled on the importance of diet, exercise, and its role in overall health and mortality. The patient's FH and SH was reviewed, including their home life, tobacco status, and drug and alcohol status.   Vaccines: Uptodate with Tdap, shingles vaccine,PNA, flu shot.    Mammo:  05/2021  diagnostic pap/dve :  Pap not indicated. No DVE given no family history of ovarian uterine cancer Colon: No family history of colon cancer known.  Cologuard  01/2021 positive.. colonoscopy 02/2021, polyps repeat in 3 years  DEXA: 02/2019 normal.. Repeat in 5 years.  Hep C neg  Problem List Items Addressed This Visit     High cholesterol (Chronic)    Chronic, not at goal.. Statin indicated.  She would like to work on aggressive lifestyle cahnge and re-eval in 3 months with labs work. If not at goal at that point, will consider prescription med such as atorvastatin.  The 10-year ASCVD risk score (Arnett DK, et al., 2019) is: 16.4%   Values used to calculate the score:     Age: 21 years     Sex: Female     Is Non-Hispanic African  American: No     Diabetic: No     Tobacco smoker: No     Systolic Blood Pressure: 429 mmHg     Is BP treated: Yes     HDL Cholesterol: 41.3 mg/dL     Total Cholesterol: 185 mg/dL       HTN (hypertension) (Chronic)    Stable, chronic.  Continue current medication.   Amlodipine 10 mg daily  lisinopril HCTZ  20/12.5 mg daily      Morbid obesity (HCC)    Encouraged exercise, weight loss, healthy eating habits.       Prediabetes (Chronic)    Chronic stable control with lifestyle.      Other Visit Diagnoses     Routine general medical examination at a health care facility    -  Primary      Return in  about 3 months (around 05/22/2022) for lab only follow up for choleterol recheck.Eliezer Lofts, MD

## 2022-02-21 NOTE — Patient Instructions (Addendum)
Work on low carbohydrate and low carb diet.  [email or web address]

## 2022-02-21 NOTE — Assessment & Plan Note (Addendum)
Chronic, not at goal.. Statin indicated.  She would like to work on aggressive lifestyle cahnge and re-eval in 3 months with labs work. If not at goal at that point, will consider prescription med such as atorvastatin.  The 10-year ASCVD risk score (Arnett DK, et al., 2019) is: 16.4%   Values used to calculate the score:     Age: 72 years     Sex: Female     Is Non-Hispanic African American: No     Diabetic: No     Tobacco smoker: No     Systolic Blood Pressure: 814 mmHg     Is BP treated: Yes     HDL Cholesterol: 41.3 mg/dL     Total Cholesterol: 185 mg/dL

## 2022-03-12 ENCOUNTER — Other Ambulatory Visit: Payer: Self-pay | Admitting: Family Medicine

## 2022-04-13 ENCOUNTER — Other Ambulatory Visit: Payer: Self-pay | Admitting: Family Medicine

## 2022-04-25 ENCOUNTER — Other Ambulatory Visit: Payer: Self-pay | Admitting: Family Medicine

## 2022-04-25 DIAGNOSIS — Z1231 Encounter for screening mammogram for malignant neoplasm of breast: Secondary | ICD-10-CM

## 2022-05-28 ENCOUNTER — Telehealth: Payer: Self-pay | Admitting: *Deleted

## 2022-05-28 DIAGNOSIS — E7800 Pure hypercholesterolemia, unspecified: Secondary | ICD-10-CM

## 2022-05-28 DIAGNOSIS — R7303 Prediabetes: Secondary | ICD-10-CM

## 2022-05-28 NOTE — Telephone Encounter (Signed)
-----   Message from Alvina Chou sent at 05/28/2022 10:50 AM EDT ----- Regarding: Lab orders for Tuesday, 5.21.24 Lab orders for a 3 month follow up appt.

## 2022-05-31 NOTE — Addendum Note (Signed)
Addended by: Kerby Nora E on: 05/31/2022 09:56 AM   Modules accepted: Orders

## 2022-06-01 DIAGNOSIS — H524 Presbyopia: Secondary | ICD-10-CM | POA: Diagnosis not present

## 2022-06-01 DIAGNOSIS — H0288A Meibomian gland dysfunction right eye, upper and lower eyelids: Secondary | ICD-10-CM | POA: Diagnosis not present

## 2022-06-01 DIAGNOSIS — H2513 Age-related nuclear cataract, bilateral: Secondary | ICD-10-CM | POA: Diagnosis not present

## 2022-06-01 DIAGNOSIS — H0288B Meibomian gland dysfunction left eye, upper and lower eyelids: Secondary | ICD-10-CM | POA: Diagnosis not present

## 2022-06-01 DIAGNOSIS — Z0100 Encounter for examination of eyes and vision without abnormal findings: Secondary | ICD-10-CM | POA: Diagnosis not present

## 2022-06-01 DIAGNOSIS — H5213 Myopia, bilateral: Secondary | ICD-10-CM | POA: Diagnosis not present

## 2022-06-01 DIAGNOSIS — H52223 Regular astigmatism, bilateral: Secondary | ICD-10-CM | POA: Diagnosis not present

## 2022-06-05 ENCOUNTER — Ambulatory Visit
Admission: RE | Admit: 2022-06-05 | Discharge: 2022-06-05 | Disposition: A | Discharge status: Home / Self Care | Payer: Medicare HMO | Source: Ambulatory Visit | Attending: Family Medicine | Admitting: Family Medicine

## 2022-06-05 ENCOUNTER — Other Ambulatory Visit (INDEPENDENT_AMBULATORY_CARE_PROVIDER_SITE_OTHER): Payer: Medicare HMO

## 2022-06-05 DIAGNOSIS — R7303 Prediabetes: Secondary | ICD-10-CM | POA: Diagnosis not present

## 2022-06-05 DIAGNOSIS — Z1231 Encounter for screening mammogram for malignant neoplasm of breast: Secondary | ICD-10-CM

## 2022-06-05 DIAGNOSIS — E7800 Pure hypercholesterolemia, unspecified: Secondary | ICD-10-CM

## 2022-06-05 LAB — COMPREHENSIVE METABOLIC PANEL
ALT: 20 U/L (ref 0–35)
AST: 23 U/L (ref 0–37)
Albumin: 4 g/dL (ref 3.5–5.2)
Alkaline Phosphatase: 86 U/L (ref 39–117)
BUN: 20 mg/dL (ref 6–23)
CO2: 26 mEq/L (ref 19–32)
Calcium: 9.8 mg/dL (ref 8.4–10.5)
Chloride: 103 mEq/L (ref 96–112)
Creatinine, Ser: 0.9 mg/dL (ref 0.40–1.20)
GFR: 64.21 mL/min (ref >60.00)
Glucose, Bld: 96 mg/dL (ref 70–99)
Potassium: 4.2 mEq/L (ref 3.5–5.1)
Sodium: 137 mEq/L (ref 135–145)
Total Bilirubin: 0.3 mg/dL (ref 0.2–1.2)
Total Protein: 8 g/dL (ref 6.0–8.3)

## 2022-06-05 LAB — LIPID PANEL
Cholesterol: 174 mg/dL (ref 0–200)
HDL: 43.9 mg/dL (ref >39.00)
LDL Cholesterol: 107 mg/dL — ABNORMAL HIGH (ref 0–99)
NonHDL: 130.22
Total CHOL/HDL Ratio: 4
Triglycerides: 118 mg/dL (ref 0.0–149.0)
VLDL: 23.6 mg/dL (ref 0.0–40.0)

## 2022-06-05 LAB — HEMOGLOBIN A1C: Hgb A1c MFr Bld: 5.6 % (ref 4.6–6.5)

## 2022-06-21 ENCOUNTER — Ambulatory Visit (INDEPENDENT_AMBULATORY_CARE_PROVIDER_SITE_OTHER): Payer: Medicare HMO | Admitting: Family Medicine

## 2022-06-21 ENCOUNTER — Encounter: Payer: Self-pay | Admitting: Family Medicine

## 2022-06-21 VITALS — BP 138/70 | HR 64 | Temp 97.9°F | Resp 16 | Ht 67.5 in | Wt 272.0 lb

## 2022-06-21 DIAGNOSIS — E7800 Pure hypercholesterolemia, unspecified: Secondary | ICD-10-CM

## 2022-06-21 DIAGNOSIS — M25512 Pain in left shoulder: Secondary | ICD-10-CM | POA: Insufficient documentation

## 2022-06-21 MED ORDER — ATORVASTATIN CALCIUM 10 MG PO TABS: 10.0000 mg | ORAL_TABLET | Freq: Every day | ORAL | 11 refills | Status: DC

## 2022-06-21 NOTE — Patient Instructions (Addendum)
Start atorvastatin 10 mg daily.  If side effects  start, can start Co Enzyme  Q10.  Stop red yeast rice.   Start home PT.  Can ice left arm 1-3 times  a day.  Limit lifting in left arm.  Start diclofenac 75 mg twice daily for pain and inflammation.  If is not improving after 2-4 weeks.. make a follow up appt.

## 2022-06-21 NOTE — Assessment & Plan Note (Signed)
Most likely left shoulder bursitis vs tricep/deltoid strain. Start home PT.  Can ice left arm 1-3 times  a day.  Limit lifting in left arm.  Start diclofenac 75 mg twice daily for pain and inflammation.  If is not improving after 2-4 weeks.. make a follow up appt.

## 2022-06-21 NOTE — Progress Notes (Signed)
Patient ID: Cassie Nelson, female    DOB: [birth year], 72 y.o.   MRN: [id number]  This visit was conducted in person.  BP 138/70   Pulse 64   Temp 97.9 F (36.6 C)   Resp 16   Ht 5' 7.5" (1.715 m)   Wt 272 lb (123.4 kg)   SpO2 99%   BMI 41.97 kg/m    CC:  Chief Complaint  Patient presents with   Medication Consultation   Shoulder Pain    Left shoulder shoulder and arm     HPI: Cassie Nelson is a 72 y.o. female presenting on 06/21/2022 for Medication Consultation and Shoulder Pain (Left shoulder shoulder and arm )  New onset left  arm pain, not sure when started, possibly in last 4-5 months. Was doing more exercise at the time, weights/arm bands  Pain  occurs when using left arm more driving, pain in lateral upper arm.  No cahgen with moving head. Able to sleep.  Occ popping in shoulder at times.. internal and external rotation.   No preceding falls.   She has tried to treat with She notes pain most with   Hypertension:    Good control in office today on amlodipine 10 mg daily, lisinopril HCTZ 20/12.5 mg daily BP Readings from Last 3 Encounters:  06/21/22 138/70  02/21/22 138/60  03/08/21 128/65  Using medication without problems or lightheadedness:  none Chest pain with exertion: none Edema:none Short of breath:none Average home BPs:  Other issues:  Elevated Cholesterol:  Not at goal despite red yeast rice  1200 mg BID. She has tried maximal diet and lifestyle changes... she is ready to try atorvastatin. Lab Results  Component Value Date   CHOL 174 06/05/2022   HDL 43.90 06/05/2022   LDLCALC 107 (H) 06/05/2022   LDLDIRECT 125.0 07/01/2017   TRIG 118.0 06/05/2022   CHOLHDL 4 06/05/2022  Using medications without problems: Muscle aches:  Diet compliance: she is focusing more now on healthy eating Exercise: going to gym 3-4 times a week, [employer]    Other complaints: The 10-year ASCVD risk score (Arnett DK, et al., 2019) is: 16%   Values used to  calculate the score:     Age: 64 years     Sex: Female     Is Non-Hispanic African American: No     Diabetic: No     Tobacco smoker: No     Systolic Blood Pressure: 138 mmHg     Is BP treated: Yes     HDL Cholesterol: 43.9 mg/dL     Total Cholesterol: 174 mg/dL  Has lost 8 lbs in last month. Wt Readings from Last 3 Encounters:  06/21/22 272 lb (123.4 kg)  02/21/22 269 lb (122 kg)  12/15/21 260 lb (117.9 kg)  Body mass index is 41.97 kg/m.          Relevant past medical, surgical, family and social history reviewed and updated as indicated. Interim medical history since our last visit reviewed. Allergies and medications reviewed and updated. Outpatient Medications Prior to Visit  Medication Sig Dispense Refill   amLODipine (NORVASC) 10 MG tablet TAKE 1 TABLET BY MOUTH EVERY DAY 90 tablet 3   amoxicillin (AMOXIL) 500 MG capsule Take 2,000 mg by mouth. 1 to 2 hours prior to dental work     CALCIUM-VITAMIN D PO Take 600 mg by mouth daily.      lisinopril-hydrochlorothiazide (ZESTORETIC) 20-12.5 MG tablet TAKE 1 TABLET BY MOUTH EVERY DAY 90  tablet 3   Multiple Vitamin (MULTIVITAMIN) tablet Take 1 tablet by mouth daily. Centrum Silver     Multiple Vitamins-Minerals (HAIR VITAMINS) TABS Take 2 tablets by mouth at bedtime. Keranique KeraViatin Hair & Scalp Health Supplements or [employer] Healthy Hair Vitamin     Red Yeast Rice 600 MG CAPS Take 1,200 mg by mouth 2 (two) times daily.      No facility-administered medications prior to visit.     Per HPI unless specifically indicated in ROS section below Review of Systems  Constitutional:  Negative for fatigue and fever.  HENT:  Negative for ear pain.   Eyes:  Negative for pain.  Respiratory:  Negative for chest tightness and shortness of breath.   Cardiovascular:  Negative for chest pain, palpitations and leg swelling.  Gastrointestinal:  Negative for abdominal pain.  Genitourinary:  Negative for dysuria.   Objective:   BP 138/70   Pulse 64   Temp 97.9 F (36.6 C)   Resp 16   Ht 5' 7.5" (1.715 m)   Wt 272 lb (123.4 kg)   SpO2 99%   BMI 41.97 kg/m   Wt Readings from Last 3 Encounters:  06/21/22 272 lb (123.4 kg)  02/21/22 269 lb (122 kg)  12/15/21 260 lb (117.9 kg)      Physical Exam Constitutional:      General: She is not in acute distress.    Appearance: Normal appearance. She is well-developed. She is obese. She is not ill-appearing or toxic-appearing.  HENT:     Head: Normocephalic.     Right Ear: Hearing normal. Tympanic membrane is not erythematous, retracted or bulging.     Left Ear: Hearing normal. Tympanic membrane is not erythematous, retracted or bulging.     Nose: No mucosal edema or rhinorrhea.     Right Sinus: No maxillary sinus tenderness or frontal sinus tenderness.     Left Sinus: No maxillary sinus tenderness or frontal sinus tenderness.     Mouth/Throat:     Pharynx: Uvula midline.  Eyes:     General: Lids are normal.     Conjunctiva/sclera: Conjunctivae normal.     Pupils: Pupils are equal, round, and reactive to light.  Neck:     Thyroid: No thyroid mass or thyromegaly.     Vascular: No carotid bruit.     Trachea: Trachea normal.     Comments: Negative Spurling's  Cardiovascular:     Rate and Rhythm: Normal rate and regular rhythm.     Pulses: Normal pulses.     Heart sounds: Normal heart sounds, S1 normal and S2 normal. No murmur heard.    No friction rub. No gallop.  Pulmonary:     Effort: Pulmonary effort is normal. No tachypnea or respiratory distress.     Breath sounds: Normal breath sounds. No decreased breath sounds, wheezing, rhonchi or rales.  Abdominal:     General: Bowel sounds are normal.     Palpations: Abdomen is soft.     Tenderness: There is no abdominal tenderness.  Musculoskeletal:     Left shoulder: Tenderness present. No bony tenderness. Decreased range of motion.     Cervical back: Normal range of motion and neck supple. No edema,  erythema, signs of trauma or rigidity. No pain with movement, spinous process tenderness or muscular tenderness. Normal range of motion.     Comments: TTP over lateral upper left arm and over deltoid bursa  Skin:    General: Skin is warm and dry.  Findings: No rash.  Neurological:     Mental Status: She is alert.  Psychiatric:        Mood and Affect: Mood is not anxious or depressed.        Speech: Speech normal.        Behavior: Behavior normal. Behavior is cooperative.        Thought Content: Thought content normal.        Judgment: Judgment normal.       Results for orders placed or performed in visit on 06/05/22  Hemoglobin A1c  Result Value Ref Range   Hgb A1c MFr Bld 5.6 4.6 - 6.5 %  Comprehensive metabolic panel  Result Value Ref Range   Sodium 137 135 - 145 mEq/L   Potassium 4.2 3.5 - 5.1 mEq/L   Chloride 103 96 - 112 mEq/L   CO2 26 19 - 32 mEq/L   Glucose, Bld 96 70 - 99 mg/dL   BUN 20 6 - 23 mg/dL   Creatinine, Ser 5.40 0.40 - 1.20 mg/dL   Total Bilirubin 0.3 0.2 - 1.2 mg/dL   Alkaline Phosphatase 86 39 - 117 U/L   AST 23 0 - 37 U/L   ALT 20 0 - 35 U/L   Total Protein 8.0 6.0 - 8.3 g/dL   Albumin 4.0 3.5 - 5.2 g/dL   GFR 98.11 >91.47 mL/min   Calcium 9.8 8.4 - 10.5 mg/dL  Lipid panel  Result Value Ref Range   Cholesterol 174 0 - 200 mg/dL   Triglycerides 829.5 0.0 - 149.0 mg/dL   HDL 62.13 >08.65 mg/dL   VLDL 78.4 0.0 - 69.6 mg/dL   LDL Cholesterol 295 (H) 0 - 99 mg/dL   Total CHOL/HDL Ratio 4    NonHDL 130.22     This visit occurred during the SARS-CoV-2 public health emergency.  Safety protocols were in place, including screening questions prior to the visit, additional usage of staff PPE, and extensive cleaning of exam room while observing appropriate contact time as indicated for disinfecting solutions.   COVID 19 screen:  No recent travel or known exposure to COVID19 The patient denies respiratory symptoms of COVID 19 at this time. The importance  of social distancing was discussed today.   Assessment and Plan  Problem List Items Addressed This Visit     Acute pain of left shoulder - Primary     Most likely left shoulder bursitis vs tricep/deltoid strain. Start home PT.  Can ice left arm 1-3 times  a day.  Limit lifting in left arm.  Start diclofenac 75 mg twice daily for pain and inflammation.  If is not improving after 2-4 weeks.. make a follow up appt.      High cholesterol (Chronic)     Start atorvastatin 10 mg daily.  If side effects  start, can start Co Enzyme  Q10.  Stop red yeast rice.      Relevant Medications   atorvastatin (LIPITOR) 10 MG tablet   Other Visit Diagnoses     Hypercholesterolemia       Relevant Medications   atorvastatin (LIPITOR) 10 MG tablet   Other Relevant Orders   Lipid panel   Comprehensive metabolic panel     Return for lab only.  Kerby Nora, MD

## 2022-06-21 NOTE — Assessment & Plan Note (Signed)
Start atorvastatin 10 mg daily.  If side effects  start, can start Co Enzyme  Q10.  Stop red yeast rice.

## 2022-06-22 ENCOUNTER — Encounter: Payer: Self-pay | Admitting: Family Medicine

## 2022-06-22 ENCOUNTER — Other Ambulatory Visit: Payer: Self-pay | Admitting: Family Medicine

## 2022-06-22 MED ORDER — DICLOFENAC SODIUM 75 MG PO TBEC
75.0000 mg | DELAYED_RELEASE_TABLET | Freq: Two times a day (BID) | ORAL | 0 refills | Status: DC
Start: 2022-06-22 — End: 2022-11-07

## 2022-09-19 ENCOUNTER — Other Ambulatory Visit (INDEPENDENT_AMBULATORY_CARE_PROVIDER_SITE_OTHER): Payer: Medicare HMO

## 2022-09-19 DIAGNOSIS — E7800 Pure hypercholesterolemia, unspecified: Secondary | ICD-10-CM

## 2022-09-19 LAB — COMPREHENSIVE METABOLIC PANEL
ALT: 22 U/L (ref 0–35)
AST: 22 U/L (ref 0–37)
Albumin: 3.8 g/dL (ref 3.5–5.2)
Alkaline Phosphatase: 97 U/L (ref 39–117)
BUN: 17 mg/dL (ref 6–23)
CO2: 28 meq/L (ref 19–32)
Calcium: 9.6 mg/dL (ref 8.4–10.5)
Chloride: 102 meq/L (ref 96–112)
Creatinine, Ser: 0.87 mg/dL (ref 0.40–1.20)
GFR: 66.74 mL/min (ref >60.00)
Glucose, Bld: 96 mg/dL (ref 70–99)
Potassium: 4.4 meq/L (ref 3.5–5.1)
Sodium: 137 meq/L (ref 135–145)
Total Bilirubin: 0.4 mg/dL (ref 0.2–1.2)
Total Protein: 7.7 g/dL (ref 6.0–8.3)

## 2022-09-19 LAB — LIPID PANEL
Cholesterol: 142 mg/dL (ref 0–200)
HDL: 33.3 mg/dL — ABNORMAL LOW (ref >39.00)
LDL Cholesterol: 89 mg/dL (ref 0–99)
NonHDL: 108.8
Total CHOL/HDL Ratio: 4
Triglycerides: 100 mg/dL (ref 0.0–149.0)
VLDL: 20 mg/dL (ref 0.0–40.0)

## 2022-10-29 ENCOUNTER — Ambulatory Visit (INDEPENDENT_AMBULATORY_CARE_PROVIDER_SITE_OTHER): Payer: Medicare HMO

## 2022-10-29 VITALS — Ht 67.0 in | Wt 270.0 lb

## 2022-10-29 DIAGNOSIS — Z0000 Encounter for general adult medical examination without abnormal findings: Secondary | ICD-10-CM | POA: Diagnosis not present

## 2022-10-29 NOTE — Progress Notes (Signed)
Subjective:   Cassie Nelson is a 72 y.o. female who presents for Medicare Annual (Subsequent) preventive examination.  Visit Complete: Virtual I connected with  Chaunice Obie Cuny on 10/29/22 by a audio enabled telemedicine application and verified that I am speaking with the correct person using two identifiers.  Patient Location: Home  Provider Location: Home Office  I discussed the limitations of evaluation and management by telemedicine. The patient expressed understanding and agreed to proceed.  Vital Signs: Because this visit was a virtual/telehealth visit, some criteria may be missing or patient reported. Any vitals not documented were not able to be obtained and vitals that have been documented are patient reported.  Patient Medicare AWV questionnaire was completed by the patient on 10/29/2022; I have confirmed that all information answered by patient is correct and no changes since this date.  Cardiac Risk Factors include: advanced age (>63men, >21 women);dyslipidemia;hypertension     Objective:    Today's Vitals   10/29/22 1502  Weight: 270 lb (122.5 kg)  Height: 5\' 7"  (1.702 m)   Body mass index is 42.29 kg/m.     10/29/2022    3:06 PM 12/15/2021   10:40 AM 03/08/2021    6:54 AM 12/14/2020   11:19 AM 12/17/2018    6:42 AM 12/15/2018    9:35 AM 07/03/2012    8:31 AM  Advanced Directives  Does Patient Have a Medical Advance Directive? Yes Yes Yes Yes Yes Yes Patient does not have advance directive  Type of Advance Directive Healthcare Power of [place];Living will Healthcare Power of [place];Living will Living will Healthcare Power of [place];Living will Healthcare Power of [place];Living will Healthcare Power of [place];Living will   Does patient want to make changes to medical advance directive? No - Patient declined No - Patient declined  Yes (MAU/Ambulatory/Procedural Areas - Information given)  No - Patient declined   Copy of Healthcare Power of Attorney  in Chart? Yes - validated most recent copy scanned in chart (See row information) Yes - validated most recent copy scanned in chart (See row information)  Yes - validated most recent copy scanned in chart (See row information) Yes - validated most recent copy scanned in chart (See row information) No - copy requested     Current Medications (verified) Outpatient Encounter Medications as of 10/29/2022  Medication Sig   amLODipine (NORVASC) 10 MG tablet TAKE 1 TABLET BY MOUTH EVERY DAY   amoxicillin (AMOXIL) 500 MG capsule Take 2,000 mg by mouth. 1 to 2 hours prior to dental work   atorvastatin (LIPITOR) 10 MG tablet Take 1 tablet (10 mg total) by mouth daily.   CALCIUM-VITAMIN D PO Take 600 mg by mouth daily.    lisinopril-hydrochlorothiazide (ZESTORETIC) 20-12.5 MG tablet TAKE 1 TABLET BY MOUTH EVERY DAY   Multiple Vitamin (MULTIVITAMIN) tablet Take 1 tablet by mouth daily. Centrum Silver   Multiple Vitamins-Minerals (HAIR VITAMINS) TABS Take 2 tablets by mouth at bedtime. Keranique KeraViatin Hair & Scalp Health Supplements or The Mane Choice Healthy Hair Vitamin   diclofenac (VOLTAREN) 75 MG EC tablet Take 1 tablet (75 mg total) by mouth 2 (two) times daily. (Patient not taking: Reported on 10/29/2022)   No facility-administered encounter medications on file as of 10/29/2022.    Allergies (verified) Levofloxacin   History: Past Medical History:  Diagnosis Date   Arthritis    Diverticular disease    Hx of diverticulitis of colon 2009, 2010   Hypertension    Shortness of breath  on exertion   Past Surgical History:  Procedure Laterality Date   COLONOSCOPY WITH PROPOFOL N/A 03/08/2021   Procedure: COLONOSCOPY WITH PROPOFOL;  Surgeon: Wyline Mood, MD;  Location[facility] ENDOSCOPY;  Service: Gastroenterology;  Laterality: N/A;   DILITATION & CURRETTAGE/HYSTROSCOPY WITH NOVASURE ABLATION N/A 07/03/2012   Procedure: DILATATION & CURETTAGE/HYSTEROSCOPY WITH NOVASURE ABLATION;  Surgeon:  Willodean Rosenthal, MD;  Location: [facility] ORS;  Service: Gynecology;  Laterality: N/A;   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, surgery at 64 months old   MOUTH SURGERY     biopsy of cheek   TONSILLECTOMY     TONSILLECTOMY     TOTAL HIP ARTHROPLASTY Right 12/17/2018   Procedure: TOTAL HIP ARTHROPLASTY ANTERIOR APPROACH;  Surgeon: Samson Frederic, MD;  Location: [facility] ORS;  Service: Orthopedics;  Laterality: Right;   Family History  Problem Relation Age of Onset   Heart disease Father    Cancer Father 25       colon cancer   Arthritis Father    Hypertension Father    Hypothyroidism Father    Hypertension Mother    Hypothyroidism Mother    Breast cancer Neg Hx    Social History   Socioeconomic History   Marital status: Single    Spouse name: Not on file   Number of children: 0   Years of education: Not on file   Highest education level: Bachelor's degree (e.g., BA, AB, BS)  Occupational History   Occupation: Chief Technology Officer: [employer]  Tobacco Use   Smoking status: Former    Current packs/day: 0.00    Types: Cigarettes    Start date: 06/21/1985    Quit date: 06/22/2010    Years since quitting: 12.3   Smokeless tobacco: Never  Vaping Use   Vaping status: Never Used  Substance and Sexual Activity   Alcohol use: Yes    Alcohol/week: 1.0 standard drink of alcohol    Types: 1 Standard drinks or equivalent per week    Comment: occasionally   Drug use: No   Sexual activity: Never  Other Topics Concern   Not on file  Social History Narrative   Single, No kids.   Works as a Human resources officer for [employer].   Social Determinants of Health   Financial Resource Strain: Low Risk  (10/29/2022)   Overall Financial Resource Strain (CARDIA)    Difficulty of Paying Living Expenses: Not hard at all  Food Insecurity: No Food Insecurity (10/29/2022)   Hunger Vital Sign    Worried About Running Out of Food in the Last Year: Never true    Ran Out of Food in the Last Year:  Never true  Transportation Needs: No Transportation Needs (10/29/2022)   PRAPARE - Administrator, Civil Service (Medical): No    Lack of Transportation (Non-Medical): No  Physical Activity: Insufficiently Active (10/29/2022)   Exercise Vital Sign    Days of Exercise per Week: 2 days    Minutes of Exercise per Session: 20 min  Stress: No Stress Concern Present (10/29/2022)   [employer] of Occupational Health - Occupational Stress Questionnaire    Feeling of Stress : Not at all  Social Connections: Moderately Isolated (10/29/2022)   Social Connection and Isolation Panel [NHANES]    Frequency of Communication with Friends and Family: More than three times a week    Frequency of Social Gatherings with Friends and Family: More than three times a week    Attends  Religious Services: Never    Active Member of Clubs or Organizations: Yes    Attends Engineer, structural: More than 4 times per year    Marital Status: Never married    Tobacco Counseling Counseling given: Not Answered   Clinical Intake:  Pre-visit preparation completed: Yes  Pain : No/denies pain     Nutritional Risks: None Diabetes: No  How often do you need to have someone help you when you read instructions, pamphlets, or other written materials from your doctor or pharmacy?: 1 - Never  Interpreter Needed?: No  Information entered by :: Renie Ora, LPN   Activities of Daily Living    10/29/2022    3:06 PM 10/25/2022    8:53 PM  In your present state of health, do you have any difficulty performing the following activities:  Hearing? 0 0  Vision? 0 0  Difficulty concentrating or making decisions? 0 0  Walking or climbing stairs? 0 0  Dressing or bathing? 0 0  Doing errands, shopping? 0 0  Preparing Food and eating ? N N  Using the Toilet? N N  In the past six months, have you accidently leaked urine? N Y  Do you have problems with loss of bowel control? N N  Managing your  Medications? N N  Managing your Finances? N N  Housekeeping or managing your Housekeeping? N N    Patient Care Team: Excell Seltzer, MD as PCP - General (Family Medicine)  Indicate any recent Medical Services you may have received from other than [facility] providers in the past year (date may be approximate).     Assessment:   This is a routine wellness examination for Cassie Nelson.  Hearing/Vision screen Vision Screening - Comments:: Wears rx glasses - up to date with routine eye exams with  Dr.Bucowski   Goals Addressed             This Visit's Progress    Exercise 3x per week (30 min per time)         Depression Screen    10/29/2022    3:05 PM 06/21/2022    9:54 AM 02/21/2022   10:31 AM 12/15/2021   10:37 AM 12/14/2020   11:21 AM 10/16/2019   10:23 AM 09/23/2018   10:11 AM  PHQ 2/9 Scores  PHQ - 2 Score 0 0 0 0 0 0 0  PHQ- 9 Score  0 0        Fall Risk    10/29/2022    3:03 PM 10/25/2022    8:53 PM 06/21/2022    9:54 AM 02/21/2022   10:31 AM 12/15/2021   10:41 AM  Fall Risk   Falls in the past year? 0 0 0 0 0  Number falls in past yr: 0 0 0 0   Injury with Fall? 0 0 0 0   Risk for fall due to : No Fall Risks  No Fall Risks No Fall Risks No Fall Risks  Follow up Falls prevention discussed  Falls evaluation completed Falls evaluation completed Falls evaluation completed;Falls prevention discussed    MEDICARE RISK AT HOME: Medicare Risk at Home Any stairs in or around the home?: Yes If so, are there any without handrails?: No Home free of loose throw rugs in walkways, pet beds, electrical cords, etc?: Yes Adequate lighting in your home to reduce risk of falls?: Yes Life alert?: No Use of a cane, walker or w/c?: No Grab bars in the bathroom?: Yes Shower chair  or bench in shower?: Yes Elevated toilet seat or a handicapped toilet?: Yes  TIMED UP AND GO:  Was the test performed?  No    Cognitive Function:        10/29/2022    3:07 PM 12/15/2021   10:49 AM  6CIT  Screen  What Year? 0 points 0 points  What month? 0 points 0 points  What time? 0 points 0 points  Count back from 20 0 points 0 points  Months in reverse 0 points 0 points  Repeat phrase 0 points 0 points  Total Score 0 points 0 points    Immunizations Immunization History  Administered Date(s) Administered   Fluad Quad(high Dose 65+) 10/28/2018, 10/16/2019, 10/18/2020   Influenza, High Dose Seasonal PF 10/20/2021, 10/17/2022   Influenza,inj,Quad PF,6+ Mos 10/20/2013, 11/11/2014, 11/07/2015, 11/07/2016, 10/09/2017   Moderna Covid-19 Fall Seasonal Vaccine 68yrs & older 01/01/2022   PFIZER(Purple Top)SARS-COV-2 Vaccination 02/28/2019, 03/25/2019, 10/31/2019, 06/14/2020   Pfizer Covid-19 Vaccine Bivalent Booster 66yrs & up 10/18/2020, 06/02/2021   Pfizer(Comirnaty)Fall Seasonal Vaccine 12 years and older 10/20/2022   Pneumococcal Conjugate-13 03/09/2016   Pneumococcal Polysaccharide-23 07/12/2017   Respiratory Syncytial Virus Vaccine,Recomb Aduvanted(Arexvy) 10/20/2021   Tdap 05/22/2012   Zoster Recombinant(Shingrix) 02/08/2021, 05/26/2021   Zoster, Live 08/04/2013    TDAP status: Due, Education has been provided regarding the importance of this vaccine. Advised may receive this vaccine at local pharmacy or Health Dept. Aware to provide a copy of the vaccination record if obtained from local pharmacy or Health Dept. Verbalized acceptance and understanding.  Flu Vaccine status: Up to date  Pneumococcal vaccine status: Up to date  Covid-19 vaccine status: Completed vaccines  Qualifies for Shingles Vaccine? Yes   Zostavax completed Yes   Shingrix Completed?: Yes  Screening Tests Health Maintenance  Topic Date Due   DTaP/Tdap/Td (2 - Td or Tdap) 05/23/2022   MAMMOGRAM  06/05/2023   Medicare Annual Wellness (AWV)  10/29/2023   DEXA SCAN  02/27/2024   Colonoscopy  03/08/2024   Pneumonia Vaccine 76+ Years old  Completed   INFLUENZA VACCINE  Completed   COVID-19 Vaccine   Completed   Hepatitis C Screening  Completed   Zoster Vaccines- Shingrix  Completed   HPV VACCINES  Aged Out   Fecal DNA (Cologuard)  Discontinued    Health Maintenance  Health Maintenance Due  Topic Date Due   DTaP/Tdap/Td (2 - Td or Tdap) 05/23/2022    Colorectal cancer screening: Type of screening: Colonoscopy. Completed 03/08/2021. Repeat every 3 years  Mammogram status: Completed 06/05/2022. Repeat every year  Bone Density status: Completed 02/27/2019. Results reflect: Bone density results: OSTEOPENIA. Repeat every 5 years.  Lung Cancer Screening: (Low Dose CT Chest recommended if Age 4-80 years, 20 pack-year currently smoking OR have quit w/in 15years.) does not qualify.   Lung Cancer Screening Referral: n/a  Additional Screening:  Hepatitis C Screening: does not qualify; Completed 09/23/2018  Vision Screening: Recommended annual ophthalmology exams for early detection of glaucoma and other disorders of the eye. Is the patient up to date with their annual eye exam?  Yes  Who is the provider or what is the name of the office in which the patient attends annual eye exams? Dr.Bucowski If pt is not established with a provider, would they like to be referred to a provider to establish care? No .   Dental Screening: Recommended annual dental exams for proper oral hygiene   Community Resource Referral / Chronic Care Management: CRR required this visit?  No   CCM required this visit?  No     Plan:     I have personally reviewed and noted the following in the patient's chart:   Medical and social history Use of alcohol, tobacco or illicit drugs  Current medications and supplements including opioid prescriptions. Patient is not currently taking opioid prescriptions. Functional ability and status Nutritional status Physical activity Advanced directives List of other physicians Hospitalizations, surgeries, and ER visits in previous 12 months Vitals Screenings to  include cognitive, depression, and falls Referrals and appointments  In addition, I have reviewed and discussed with patient certain preventive protocols, quality metrics, and best practice recommendations. A written personalized care plan for preventive services as well as general preventive health recommendations were provided to patient.     Lorrene Reid, LPN   [id number]   After Visit Summary: (MyChart) Due to this being a telephonic visit, the after visit summary with patients personalized plan was offered to patient via MyChart   Nurse Notes: none

## 2022-10-29 NOTE — Patient Instructions (Signed)
[name] , Thank you for taking time to come for your Medicare Wellness Visit. I appreciate your ongoing commitment to your health goals. Please review the following plan we discussed and let me know if I can assist you in the future.   Referrals/Orders/Follow-Ups/Clinician Recommendations: Aim for 30 minutes of exercise or brisk walking, 6-8 glasses of water, and 5 servings of fruits and vegetables each day.   This is a list of the screening recommended for you and due dates:  Health Maintenance  Topic Date Due   DTaP/Tdap/Td vaccine (2 - Td or Tdap) 05/23/2022   Mammogram  06/05/2023   Medicare Annual Wellness Visit  10/29/2023   DEXA scan (bone density measurement)  02/27/2024   Colon Cancer Screening  03/08/2024   Pneumonia Vaccine  Completed   Flu Shot  Completed   COVID-19 Vaccine  Completed   Hepatitis C Screening  Completed   Zoster (Shingles) Vaccine  Completed   HPV Vaccine  Aged Out   Cologuard (Stool DNA test)  Discontinued    Advanced directives: (In Chart) A copy of your advanced directives are scanned into your chart should your provider ever need it.  Next Medicare Annual Wellness Visit scheduled for next year: Yes  Insert Preventive Care attachment Insert FALL PREVENTION attachment if needed

## 2022-11-07 ENCOUNTER — Ambulatory Visit: Payer: Medicare HMO | Admitting: Family Medicine

## 2022-11-07 ENCOUNTER — Other Ambulatory Visit: Payer: Self-pay | Admitting: Family Medicine

## 2022-11-07 VITALS — BP 134/60 | HR 89 | Temp 99.2°F | Ht 67.5 in | Wt 283.4 lb

## 2022-11-07 DIAGNOSIS — M109 Gout, unspecified: Secondary | ICD-10-CM | POA: Diagnosis not present

## 2022-11-07 MED ORDER — COLCHICINE 0.6 MG PO TABS: 0.6000 mg | ORAL_TABLET | Freq: Two times a day (BID) | ORAL | 0 refills | Status: DC

## 2022-11-07 MED ORDER — INDOMETHACIN 50 MG PO CAPS: 50.0000 mg | ORAL_CAPSULE | Freq: Three times a day (TID) | ORAL | 0 refills | Status: AC

## 2022-11-07 NOTE — Telephone Encounter (Signed)
Alternative Requested:THE PRESCRIBED MEDICATION IS NOT COVERED BY INSURANCE. PLEASE CONSIDER CHANGING TO ONE OF THE SUGGESTED COVERED ALTERNATIVES.   Alterative fenoprofen.

## 2022-11-07 NOTE — Progress Notes (Unsigned)
Cassie Authier T. Vaishali Baise, MD, CAQ Sports Medicine [facility] at [facility] [address] [place], [address]  Phone: [phone]  FAX: [phone]  Cassie Nelson - 72 y.o. female  MRN [id number]  Date of Birth: 03-Feb-1950  Date: 11/07/2022  PCP: Excell Seltzer, MD  Referral: Excell Seltzer, MD  Chief Complaint  Patient presents with   Swollen Toe    Left Big Toe   Subjective:   Cassie Nelson is a 72 y.o. very pleasant female patient with Body mass index is 43.73 kg/m. who presents with the following:  The patient has some left-sided first MTP joint pain, redness, and swelling that has been present for greater than 3 days.  She has not tried anything at home without any kind of significant success.  She does have a history of gout from roughly 7 years ago, and I saw her at that time.  She did recover with oral indomethacin.  Review of Systems is noted in the HPI, as appropriate  Objective:   BP 134/60 (BP Location: Left Arm, Patient Position: Sitting, Cuff Size: Large)   Pulse 89   Temp 99.2 F (37.3 C) (Temporal)   Ht 5' 7.5" (1.715 m)   Wt 283 lb 6 oz (128.5 kg)   SpO2 99%   BMI 43.73 kg/m   GEN: No acute distress; alert,appropriate. PULM: Breathing comfortably in no respiratory distress PSYCH: Normally interactive.   Left first MTP joint pain as below with predominant pain at the MTP joint.  There is also some warmth and redness associated.  No significant or minimal significant pain in the IP joint.  The midfoot is nontender as well as the ankle.    Laboratory and Imaging Data:  Assessment and Plan:     ICD-10-CM   1. Gouty arthritis of left great toe  M10.9      Acute on chronic gout versus CPPD with exacerbation.  No prior aspirate.  I am going have the patient start some indomethacin as well as colchicine twice daily.  I would anticipate that she will do well.  I asked her to call on Friday if her symptoms are not  improving, and at that point I would start some oral prednisone.  Medication Management during today's office visit: Meds ordered this encounter  Medications   indomethacin (INDOCIN) 50 MG capsule    Sig: Take 1 capsule (50 mg total) by mouth 3 (three) times daily with meals.    Dispense:  60 capsule    Refill:  0   colchicine 0.6 MG tablet    Sig: Take 1 tablet (0.6 mg total) by mouth 2 (two) times daily.    Dispense:  60 tablet    Refill:  0   Medications Discontinued During This Encounter  Medication Reason   diclofenac (VOLTAREN) 75 MG EC tablet Completed Course    Orders placed today for conditions managed today: No orders of the defined types were placed in this encounter.   Disposition: No follow-ups on file.  Dragon [employer] One speech-to-text software was used for transcription in this dictation.  Possible transcriptional errors can occur using Animal nutritionist.   Signed,  Elpidio Galea. Vianne Grieshop, MD   Outpatient Encounter Medications as of 11/07/2022  Medication Sig   amLODipine (NORVASC) 10 MG tablet TAKE 1 TABLET BY MOUTH EVERY DAY   amoxicillin (AMOXIL) 500 MG capsule Take 2,000 mg by mouth. 1 to 2 hours prior to dental work   atorvastatin (  LIPITOR) 10 MG tablet Take 1 tablet (10 mg total) by mouth daily.   CALCIUM-VITAMIN D PO Take 600 mg by mouth daily.    colchicine 0.6 MG tablet Take 1 tablet (0.6 mg total) by mouth 2 (two) times daily.   indomethacin (INDOCIN) 50 MG capsule Take 1 capsule (50 mg total) by mouth 3 (three) times daily with meals.   lisinopril-hydrochlorothiazide (ZESTORETIC) 20-12.5 MG tablet TAKE 1 TABLET BY MOUTH EVERY DAY   Multiple Vitamin (MULTIVITAMIN) tablet Take 1 tablet by mouth daily. Centrum Silver   Multiple Vitamins-Minerals (HAIR VITAMINS) TABS Take 2 tablets by mouth at bedtime. Keranique KeraViatin Hair & Scalp Health Supplements or The Mane Choice Healthy Hair Vitamin   [DISCONTINUED] diclofenac (VOLTAREN) 75 MG EC tablet Take 1  tablet (75 mg total) by mouth 2 (two) times daily. (Patient not taking: Reported on 10/29/2022)   No facility-administered encounter medications on file as of 11/07/2022.

## 2022-11-07 NOTE — Patient Instructions (Addendum)
Low-Purine Eating Plan A low-purine eating plan involves making food choices to limit your purine intake. Purine is a kind of uric acid. Too much uric acid in your blood can cause certain conditions, such as gout and kidney stones. Eating a low-purine diet may help control these conditions. What are tips for following this plan? Shopping Avoid buying products that contain high-fructose corn syrup. Check for this on food labels. It is commonly found in many processed foods and soft drinks. Be sure to check for it in baked goods such as cookies, canned fruits, and cereals and cereal bars. Avoid buying veal, chicken breast with skin, lamb, and organ meats such as liver. These types of meats tend to have the highest purine content. Choose dairy products. These may lower uric acid levels. Avoid certain types of fish. Not all fish and seafood have high purine content. Examples with high purine content include anchovies, trout, tuna, sardines, and salmon. Avoid buying beverages that contain alcohol, particularly beer and hard liquor. Alcohol can affect the way your body gets rid of uric acid. Meal planning  Learn which foods do or do not affect you. If you find out that a food tends to cause your gout symptoms to flare up, avoid eating that food. You can enjoy foods that do not cause problems. If you have any questions about a food item, talk with your dietitian or health care provider. Reduce the overall amount of meat in your diet. When you do eat meat, choose ones with lower purine content. Include plenty of fruits and vegetables. Although some vegetables may have a high purine content--such as asparagus, mushrooms, spinach, or cauliflower--it has been shown that these do not contribute to uric acid blood levels as much. Consume at least 1 dairy serving a day. This has been shown to decrease uric acid levels. General information If you drink alcohol: Limit how much you have to: 0-1 drink a day for  women who are not pregnant. 0-2 drinks a day for men. Know how much alcohol is in a drink. In the [place] one drink equals one 12 oz bottle of beer (355 mL), one 5 oz glass of wine (148 mL), or one 1 oz glass of hard liquor (44 mL). Drink plenty of water. Try to drink enough to keep your urine pale yellow. Fluids can help remove uric acid from your body. Work with your health care provider and dietitian to develop a plan to achieve or maintain a healthy weight. Losing weight may help reduce uric acid in your blood. What foods are recommended? The following are some types of foods that are good choices when limiting purine intake: Fresh or frozen fruits and vegetables. Whole grains, breads, cereals, and pasta. Rice. Beans, peas, legumes. Nuts and seeds. Dairy products. Fats and oils. The items listed above may not be a complete list. Talk with a dietitian about what dietary choices are best for you. What foods are not recommended? Limit your intake of foods high in purines, including: Beer and other alcohol. Meat-based gravy or sauce. Canned or fresh fish, such as: Anchovies, sardines, herring, salmon, and tuna. Mussels and scallops. Codfish, trout, and haddock. Bacon, veal, chicken breast with skin, and lamb. Organ meats, such as: Liver or kidney. Tripe. Sweetbreads (thymus gland or pancreas). Wild Education officer, environmental. Yeast or yeast extract supplements. Drinks sweetened with high-fructose corn syrup, such as soda. Processed foods made with high-fructose corn syrup. The items listed above may not be a complete list of foods  and beverages you should limit. Contact a dietitian for more information. Summary Eating a low-purine diet may help control conditions caused by too much uric acid in the body, such as gout or kidney stones. Choose low-purine foods, limit alcohol, and limit high-fructose corn syrup. You will learn over time which foods do or do not affect you. If you find out that a  food tends to cause your gout symptoms to flare up, avoid eating that food. This information is not intended to replace advice given to you by your health care provider. Make sure you discuss any questions you have with your health care provider. Document Revised: 12/15/2020 Document Reviewed: 12/15/2020 Elsevier Patient Education  2024 Elsevier [employer]. - if Indomethacin is not covered by your insurance

## 2022-11-07 NOTE — Telephone Encounter (Signed)
The patient and I already discussed it.  Indomethacin should be 18 dollars with [facility] - possibly even as low as 2 dollars.  I would prefer for her to use Indomethacin.

## 2022-11-29 ENCOUNTER — Other Ambulatory Visit: Payer: Self-pay | Admitting: Family Medicine

## 2022-11-29 NOTE — Telephone Encounter (Signed)
Last office visit 11/07/2022 with Dr. Patsy Lager for gouty arthritis of left great toe.  Last refilled 11/07/2022 for #60 with no refill.  Pharmacy is asking for 90 day supply (#180). Next Appt: No future appointments with PCP.  Please advise if appropriate.

## 2023-02-02 IMAGING — MG MM DIGITAL SCREENING BILAT W/ TOMO AND CAD
8 series · 8 of 24 positions shown · non-contrast
Comparison: Previous exam(s).

CLINICAL DATA: Screening.

EXAM:
DIGITAL SCREENING BILATERAL MAMMOGRAM WITH TOMOSYNTHESIS AND CAD
TECHNIQUE: Bilateral screening digital craniocaudal and mediolateral oblique
mammograms were obtained. Bilateral screening digital breast
tomosynthesis was performed. The images were evaluated with
computer-aided detection.

[R MLO synth-2D]
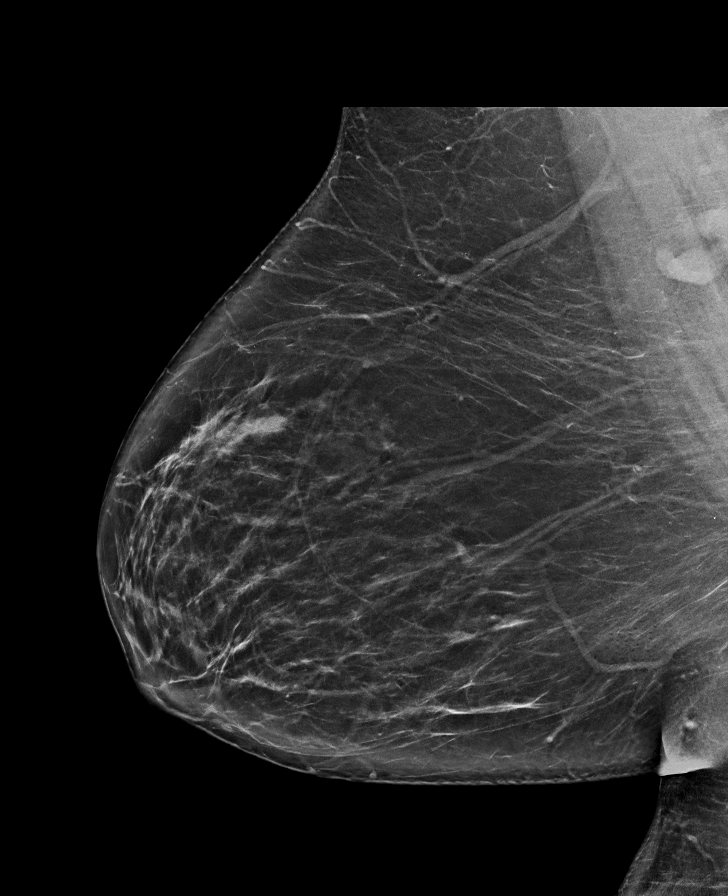

[L CC synth-2D]
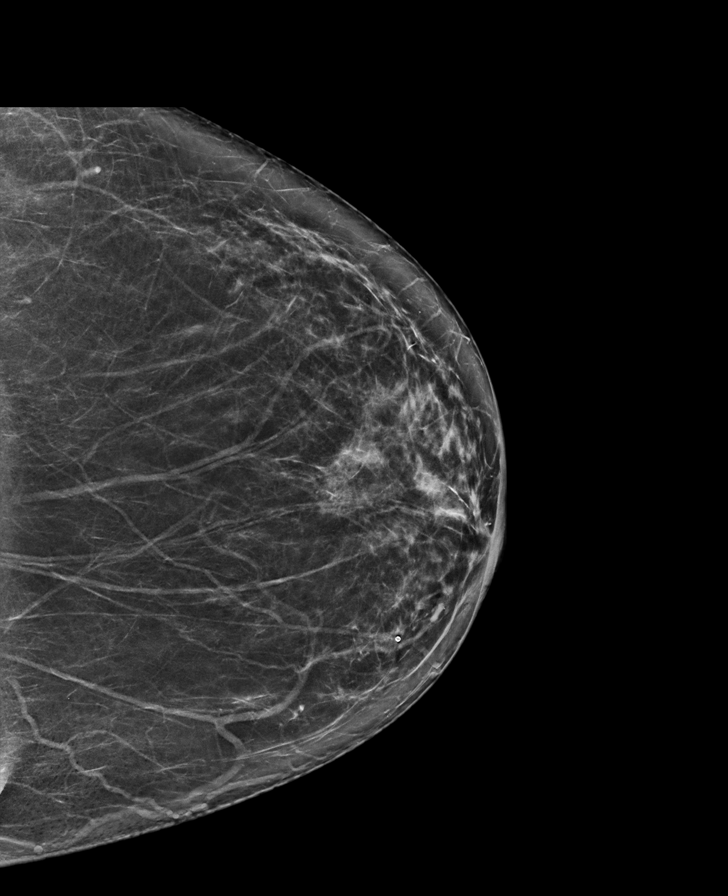

[R CC synth-2D]
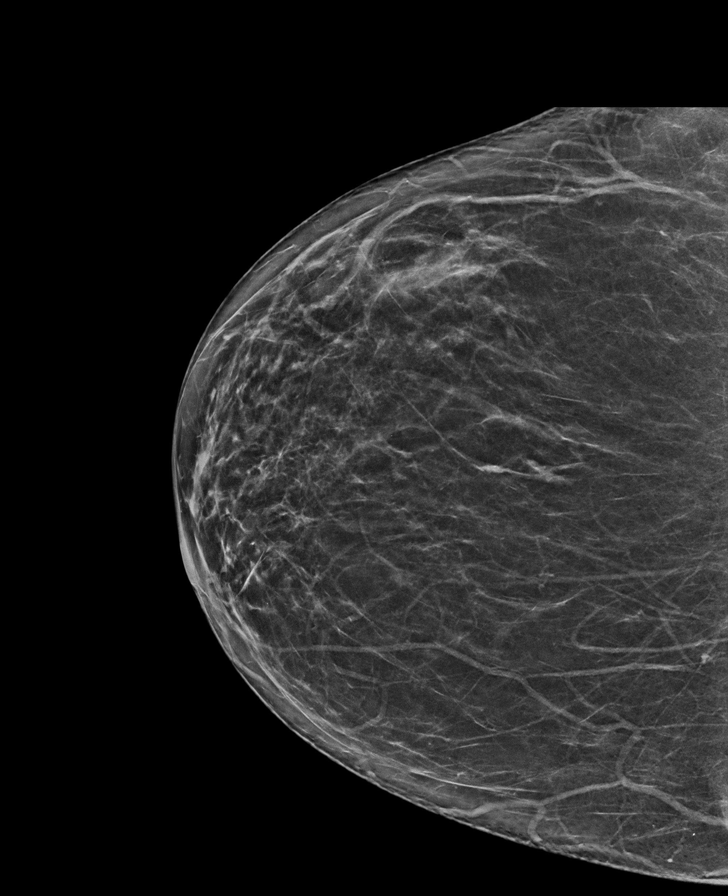

[L MLO synth-2D]
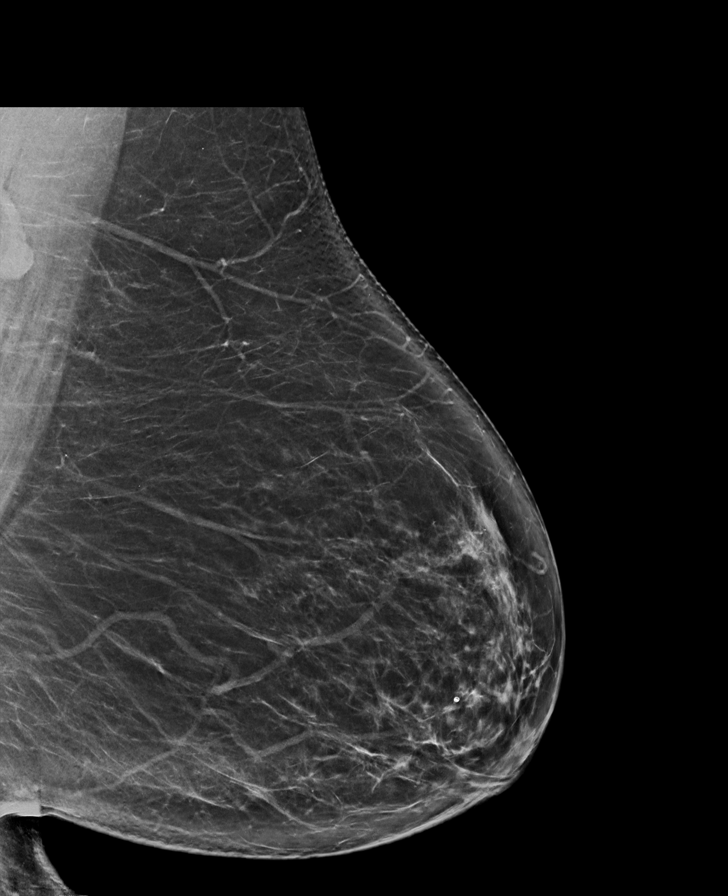

[L CC tomo · tomo slice 39/77.0]
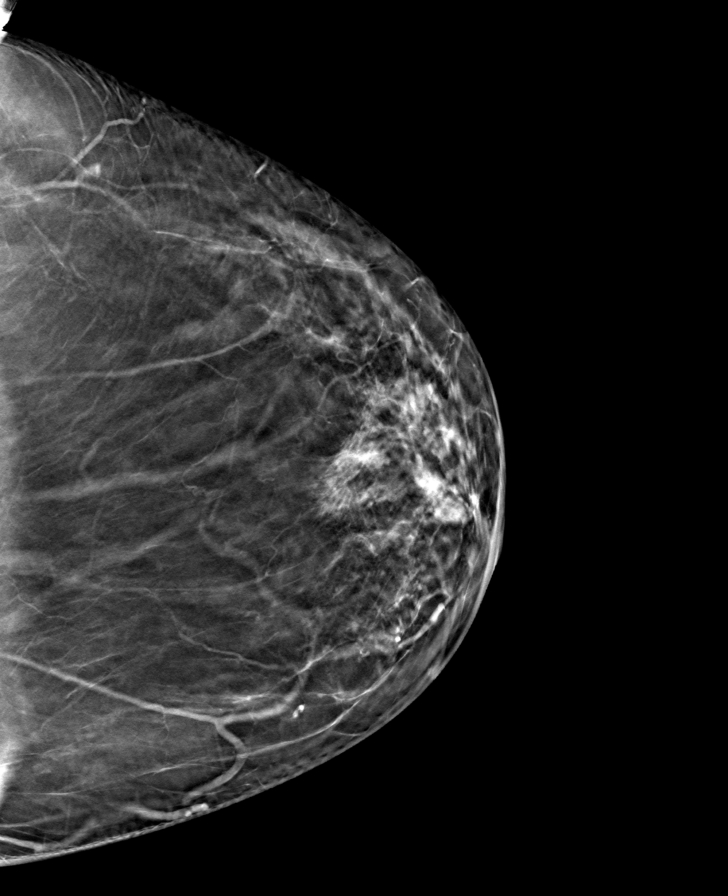

[L MLO tomo · tomo slice 43/86.0]
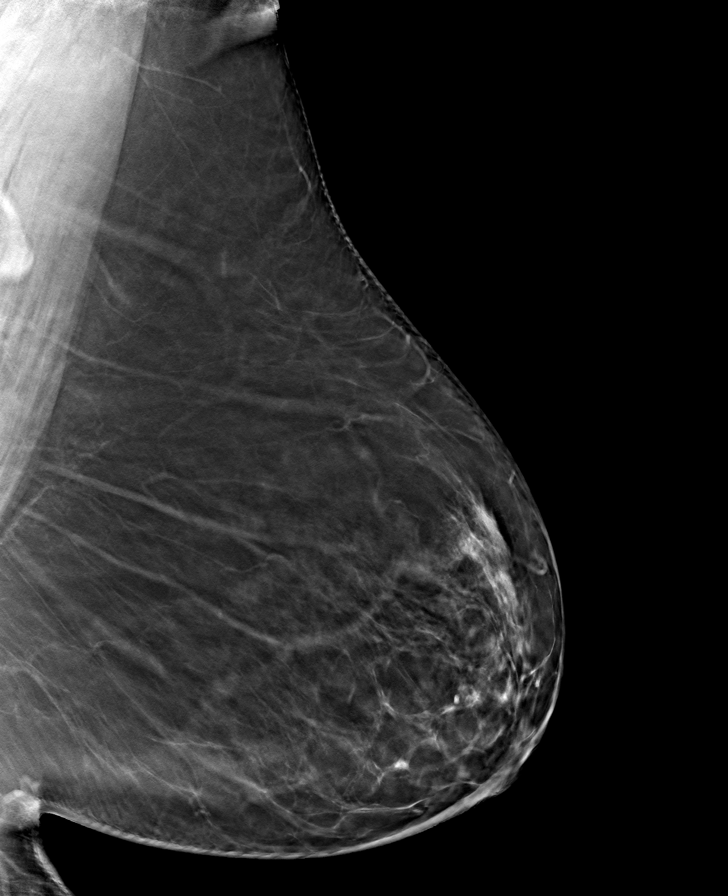

[R MLO tomo · tomo slice 46/91.0]
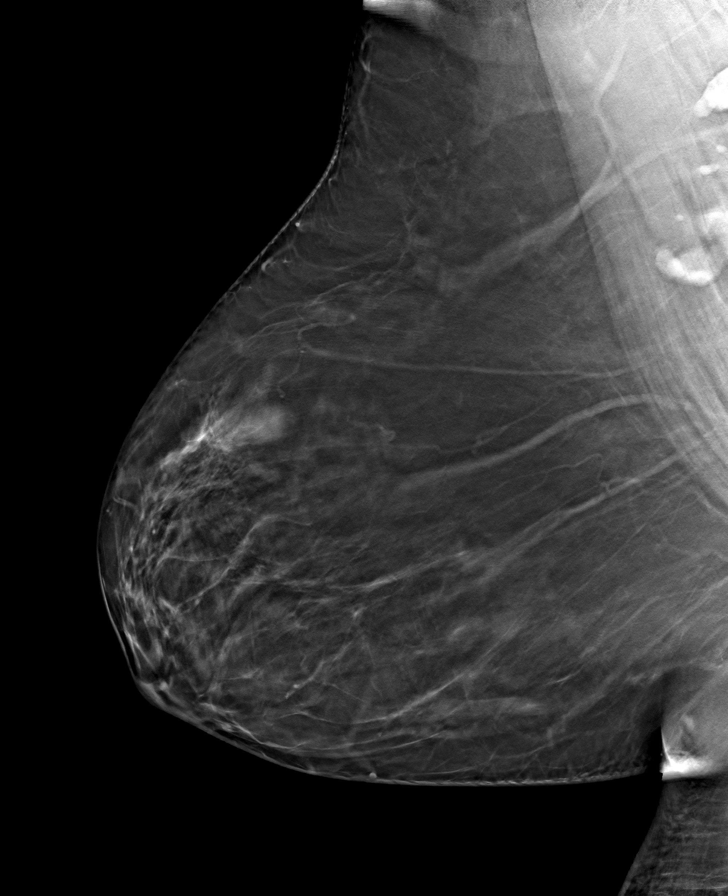

[R CC tomo · tomo slice 37/74.0]
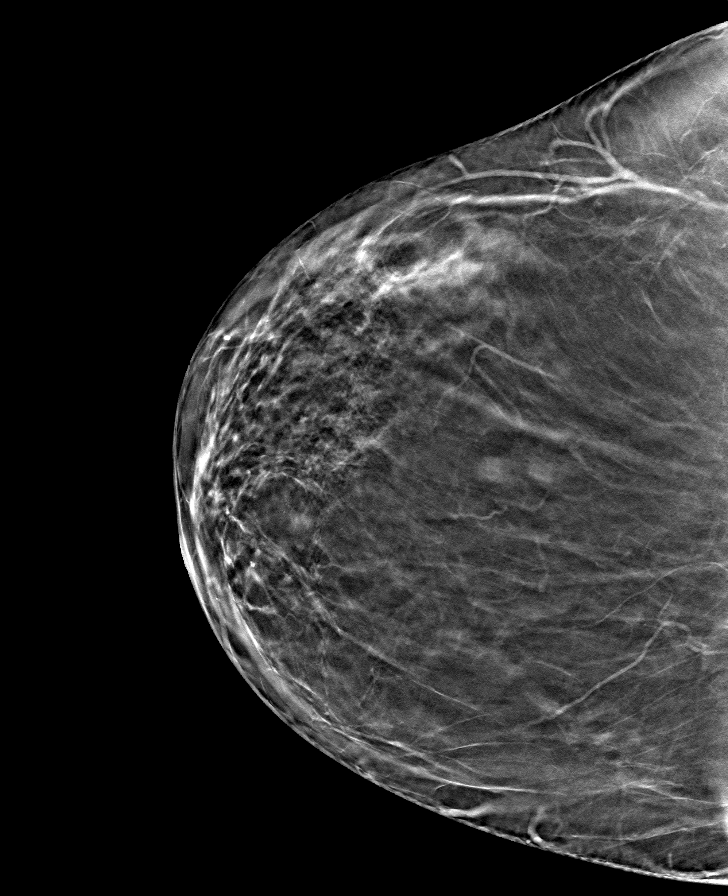

[8 of 24 positions shown; findings below may reference images not displayed]

ACR Breast Density Category b: There are scattered areas of
fibroglandular density.
FINDINGS: There are no findings suspicious for malignancy.
IMPRESSION: No mammographic evidence of malignancy. A result letter of this
screening mammogram will be mailed directly to the patient.

RECOMMENDATION:
Screening mammogram in one year. (Code:51-O-LD2)

BI-RADS CATEGORY  1: Negative.

## 2023-02-24 ENCOUNTER — Other Ambulatory Visit: Payer: Self-pay | Admitting: Family Medicine

## 2023-02-25 MED ORDER — ATORVASTATIN CALCIUM 10 MG PO TABS: 10.0000 mg | ORAL_TABLET | Freq: Every day | ORAL | 0 refills | Status: DC

## 2023-02-25 NOTE — Telephone Encounter (Signed)
 Patient has been scheduled. She stated that she is low on her atorvastatin  (LIPITOR) 10 MG tablet and needing a refill.

## 2023-02-25 NOTE — Telephone Encounter (Signed)
Please schedule CPE with fasting labs prior with Dr. Bedsole.  

## 2023-02-27 ENCOUNTER — Telehealth: Payer: Self-pay | Admitting: *Deleted

## 2023-02-27 DIAGNOSIS — R7303 Prediabetes: Secondary | ICD-10-CM

## 2023-02-27 DIAGNOSIS — E7800 Pure hypercholesterolemia, unspecified: Secondary | ICD-10-CM

## 2023-02-27 NOTE — Telephone Encounter (Signed)
-----   Message from Alvina Chou sent at 02/27/2023  8:47 AM EST ----- Regarding: Lab orders for [facility], 2.20.25 Patient is scheduled for CPX labs, please order future labs, Thanks , Camelia Eng

## 2023-03-07 ENCOUNTER — Other Ambulatory Visit: Payer: Medicare HMO

## 2023-03-18 ENCOUNTER — Other Ambulatory Visit: Payer: Medicare HMO

## 2023-03-19 ENCOUNTER — Other Ambulatory Visit (INDEPENDENT_AMBULATORY_CARE_PROVIDER_SITE_OTHER): Payer: Medicare HMO

## 2023-03-19 ENCOUNTER — Encounter: Payer: Self-pay | Admitting: Family Medicine

## 2023-03-19 DIAGNOSIS — E7800 Pure hypercholesterolemia, unspecified: Secondary | ICD-10-CM | POA: Diagnosis not present

## 2023-03-19 DIAGNOSIS — R7303 Prediabetes: Secondary | ICD-10-CM | POA: Diagnosis not present

## 2023-03-19 LAB — COMPREHENSIVE METABOLIC PANEL
ALT: 28 U/L (ref 0–35)
AST: 31 U/L (ref 0–37)
Albumin: 4 g/dL (ref 3.5–5.2)
Alkaline Phosphatase: 105 U/L (ref 39–117)
BUN: 19 mg/dL (ref 6–23)
CO2: 29 meq/L (ref 19–32)
Calcium: 10.1 mg/dL (ref 8.4–10.5)
Chloride: 101 meq/L (ref 96–112)
Creatinine, Ser: 0.8 mg/dL (ref 0.40–1.20)
GFR: 73.56 mL/min (ref >60.00)
Glucose, Bld: 106 mg/dL — ABNORMAL HIGH (ref 70–99)
Potassium: 4.7 meq/L (ref 3.5–5.1)
Sodium: 136 meq/L (ref 135–145)
Total Bilirubin: 0.4 mg/dL (ref 0.2–1.2)
Total Protein: 8.3 g/dL (ref 6.0–8.3)

## 2023-03-19 LAB — LIPID PANEL
Cholesterol: 154 mg/dL (ref 0–200)
HDL: 43.8 mg/dL (ref >39.00)
LDL Cholesterol: 87 mg/dL (ref 0–99)
NonHDL: 110.06
Total CHOL/HDL Ratio: 4
Triglycerides: 117 mg/dL (ref 0.0–149.0)
VLDL: 23.4 mg/dL (ref 0.0–40.0)

## 2023-03-19 LAB — HEMOGLOBIN A1C: Hgb A1c MFr Bld: 6 % (ref 4.6–6.5)

## 2023-03-19 NOTE — Progress Notes (Signed)
 No critical labs need to be addressed urgently. We will discuss labs in detail at upcoming office visit.

## 2023-03-21 ENCOUNTER — Ambulatory Visit (INDEPENDENT_AMBULATORY_CARE_PROVIDER_SITE_OTHER): Payer: Medicare HMO | Admitting: Family Medicine

## 2023-03-21 ENCOUNTER — Encounter: Payer: Self-pay | Admitting: Family Medicine

## 2023-03-21 VITALS — BP 136/84 | HR 73 | Temp 98.1°F | Ht 67.5 in | Wt 285.0 lb

## 2023-03-21 DIAGNOSIS — I10 Essential (primary) hypertension: Secondary | ICD-10-CM

## 2023-03-21 DIAGNOSIS — Z0000 Encounter for general adult medical examination without abnormal findings: Secondary | ICD-10-CM | POA: Diagnosis not present

## 2023-03-21 DIAGNOSIS — E7800 Pure hypercholesterolemia, unspecified: Secondary | ICD-10-CM

## 2023-03-21 DIAGNOSIS — R7303 Prediabetes: Secondary | ICD-10-CM | POA: Diagnosis not present

## 2023-03-21 NOTE — Assessment & Plan Note (Signed)
Chronic stable control with lifestyle.

## 2023-03-21 NOTE — Patient Instructions (Signed)
  Cassie Nelson , Thank you for taking time to come for your Medicare Wellness Visit. I appreciate your ongoing commitment to your health goals. Please review the following plan we discussed and let me know if I can assist you in the future.   These are the goals we discussed:  Goals       Exercise 3x per week (30 min per time)      Weight loss goal 190lb (pt-stated)      Would like to get back on track with weight loss. Exercise more at [facility]        This is a list of the screening recommended for you and due dates:  Health Maintenance  Topic Date Due   Mammogram  06/05/2023   Medicare Annual Wellness Visit  10/29/2023   DEXA scan (bone density measurement)  02/27/2024   Colon Cancer Screening  03/08/2024   DTaP/Tdap/Td vaccine (3 - Td or Tdap) 02/24/2033   Pneumonia Vaccine  Completed   Flu Shot  Completed   COVID-19 Vaccine  Completed   Hepatitis C Screening  Completed   Zoster (Shingles) Vaccine  Completed   HPV Vaccine  Aged Out   Cologuard (Stool DNA test)  Discontinued

## 2023-03-21 NOTE — Assessment & Plan Note (Signed)
 Stable, chronic.  LDL now at goal less than 811.  continue current medication.  atorvastatin 10 mg daily.

## 2023-03-21 NOTE — Assessment & Plan Note (Signed)
Stable, chronic.  Continue current medication.   Amlodipine 10 mg daily  lisinopril HCTZ  20/12.5 mg daily 

## 2023-03-21 NOTE — Assessment & Plan Note (Signed)
 Encouraged exercise, weight loss, healthy eating habits. ? ?

## 2023-03-21 NOTE — Progress Notes (Signed)
 Patient ID: Adreona Brand, female    DOB: 10-14-1950, 73 y.o.   MRN: [id number]  This visit was conducted in person.  BP 136/84   Pulse 73   Temp 98.1 F (36.7 C) (Temporal)   Ht 5' 7.5" (1.715 m)   Wt 285 lb (129.3 kg)   SpO2 98%   BMI 43.98 kg/m    CC:  Chief Complaint  Patient presents with   Annual Exam    HPI: Yesica Kemler is a 73 y.o. female presenting on 03/21/2023 for Annual Exam  The patient presents for  complete physical and review of chronic health problems. He/She also has the following acute concerns today:  none   The patient saw a LPN or RN for medicare wellness visit. 10/29/2022  Prevention and wellness was reviewed in detail. Note reviewed and important notes copied below.  Hypertension:    Good control in office today on amlodipine 10 mg daily, lisinopril HCTZ 20/12.5 mg daily BP Readings from Last 3 Encounters:  03/21/23 136/84  11/07/22 134/60  06/21/22 138/70  Using medication without problems or lightheadedness:  none Chest pain with exertion: none Edema:none Short of breath some with exertion.Marland Kitchen deconditioned. Average home BPs: 112/68 Other issues:   Hypercholesterolemia: Improved control on atorvastatin 10 mg daily. Lab Results  Component Value Date   CHOL 154 03/19/2023   HDL 43.80 03/19/2023   LDLCALC 87 03/19/2023   LDLDIRECT 125.0 07/01/2017   TRIG 117.0 03/19/2023   CHOLHDL 4 03/19/2023  Using medications without problems: None Muscle aches:  Diet compliance: she is focusing more now on healthy eating.Marland Kitchen weight watchers Exercise:  none, doing some hip and leg exercise that she got after hip replacement. Other complaints:   Wt Readings from Last 3 Encounters:  03/21/23 285 lb (129.3 kg)  11/07/22 283 lb 6 oz (128.5 kg)  10/29/22 270 lb (122.5 kg)  Body mass index is 43.98 kg/m.    Prediabetes  Lab Results  Component Value Date   HGBA1C 6.0 03/19/2023         Relevant past medical, surgical, family and  social history reviewed and updated as indicated. Interim medical history since our last visit reviewed. Allergies and medications reviewed and updated. Outpatient Medications Prior to Visit  Medication Sig Dispense Refill   amLODipine (NORVASC) 10 MG tablet TAKE 1 TABLET BY MOUTH EVERY DAY 90 tablet 0   atorvastatin (LIPITOR) 10 MG tablet Take 1 tablet (10 mg total) by mouth daily. 30 tablet 0   CALCIUM-VITAMIN D PO Take 600 mg by mouth daily.      lisinopril-hydrochlorothiazide (ZESTORETIC) 20-12.5 MG tablet TAKE 1 TABLET BY MOUTH EVERY DAY 90 tablet 0   Multiple Vitamin (MULTIVITAMIN) tablet Take 1 tablet by mouth daily. Centrum Silver     Multiple Vitamins-Minerals (HAIR VITAMINS) TABS Take 2 tablets by mouth at bedtime. Keranique KeraViatin Hair & Scalp Health Supplements or [employer] Healthy Hair Vitamin     amoxicillin (AMOXIL) 500 MG capsule Take 2,000 mg by mouth. 1 to 2 hours prior to dental work (Patient not taking: Reported on 03/21/2023)     colchicine 0.6 MG tablet TAKE 1 TABLET BY MOUTH 2 TIMES DAILY. (Patient not taking: Reported on 03/21/2023) 180 tablet 1   indomethacin (INDOCIN) 50 MG capsule Take 1 capsule (50 mg total) by mouth 3 (three) times daily with meals. (Patient not taking: Reported on 03/21/2023) 60 capsule 0   No facility-administered medications prior to visit.  Per HPI unless specifically indicated in ROS section below Review of Systems  Constitutional:  Negative for fatigue and fever.  HENT:  Negative for ear pain.   Eyes:  Negative for pain.  Respiratory:  Negative for chest tightness and shortness of breath.   Cardiovascular:  Negative for chest pain, palpitations and leg swelling.  Gastrointestinal:  Negative for abdominal pain.  Genitourinary:  Negative for dysuria.   Objective:  BP 136/84   Pulse 73   Temp 98.1 F (36.7 C) (Temporal)   Ht 5' 7.5" (1.715 m)   Wt 285 lb (129.3 kg)   SpO2 98%   BMI 43.98 kg/m   Wt Readings from Last 3  Encounters:  03/21/23 285 lb (129.3 kg)  11/07/22 283 lb 6 oz (128.5 kg)  10/29/22 270 lb (122.5 kg)      Physical Exam Constitutional:      General: She is not in acute distress.    Appearance: Normal appearance. She is well-developed. She is obese. She is not ill-appearing or toxic-appearing.  HENT:     Head: Normocephalic.     Right Ear: Hearing, tympanic membrane, ear canal and external ear normal. Tympanic membrane is not erythematous, retracted or bulging.     Left Ear: Hearing, tympanic membrane, ear canal and external ear normal. Tympanic membrane is not erythematous, retracted or bulging.     Nose: No mucosal edema or rhinorrhea.     Right Sinus: No maxillary sinus tenderness or frontal sinus tenderness.     Left Sinus: No maxillary sinus tenderness or frontal sinus tenderness.     Mouth/Throat:     Pharynx: Uvula midline.  Eyes:     General: Lids are normal. Lids are everted, no foreign bodies appreciated.     Conjunctiva/sclera: Conjunctivae normal.     Pupils: Pupils are equal, round, and reactive to light.  Neck:     Thyroid: No thyroid mass or thyromegaly.     Vascular: No carotid bruit.     Trachea: Trachea normal.  Cardiovascular:     Rate and Rhythm: Normal rate and regular rhythm.     Pulses: Normal pulses.     Heart sounds: Normal heart sounds, S1 normal and S2 normal. No murmur heard.    No friction rub. No gallop.  Pulmonary:     Effort: Pulmonary effort is normal. No tachypnea or respiratory distress.     Breath sounds: Normal breath sounds. No decreased breath sounds, wheezing, rhonchi or rales.  Abdominal:     General: Bowel sounds are normal.     Palpations: Abdomen is soft.     Tenderness: There is no abdominal tenderness.  Musculoskeletal:     Cervical back: Normal range of motion and neck supple.  Skin:    General: Skin is warm and dry.     Findings: No rash.  Neurological:     Mental Status: She is alert.  Psychiatric:        Mood and  Affect: Mood is not anxious or depressed.        Speech: Speech normal.        Behavior: Behavior normal. Behavior is cooperative.        Thought Content: Thought content normal.        Judgment: Judgment normal.       Results for orders placed or performed in visit on 03/19/23  Comprehensive metabolic panel   Collection Time: 03/19/23  8:28 AM  Result Value Ref Range   Sodium 136 135 -  145 mEq/L   Potassium 4.7 3.5 - 5.1 mEq/L   Chloride 101 96 - 112 mEq/L   CO2 29 19 - 32 mEq/L   Glucose, Bld 106 (H) 70 - 99 mg/dL   BUN 19 6 - 23 mg/dL   Creatinine, Ser 2.95 0.40 - 1.20 mg/dL   Total Bilirubin 0.4 0.2 - 1.2 mg/dL   Alkaline Phosphatase 105 39 - 117 U/L   AST 31 0 - 37 U/L   ALT 28 0 - 35 U/L   Total Protein 8.3 6.0 - 8.3 g/dL   Albumin 4.0 3.5 - 5.2 g/dL   GFR 62.13 >08.65 mL/min   Calcium 10.1 8.4 - 10.5 mg/dL  Lipid panel   Collection Time: 03/19/23  8:28 AM  Result Value Ref Range   Cholesterol 154 0 - 200 mg/dL   Triglycerides 784.6 0.0 - 149.0 mg/dL   HDL 96.29 >52.84 mg/dL   VLDL 13.2 0.0 - 44.0 mg/dL   LDL Cholesterol 87 0 - 99 mg/dL   Total CHOL/HDL Ratio 4    NonHDL 110.06   Hemoglobin A1c   Collection Time: 03/19/23  8:28 AM  Result Value Ref Range   Hgb A1c MFr Bld 6.0 4.6 - 6.5 %    This visit occurred during the SARS-CoV-2 public health emergency.  Safety protocols were in place, including screening questions prior to the visit, additional usage of staff PPE, and extensive cleaning of exam room while observing appropriate contact time as indicated for disinfecting solutions.   COVID 19 screen:  No recent travel or known exposure to COVID19 The patient denies respiratory symptoms of COVID 19 at this time. The importance of social distancing was discussed today.   Assessment and Plan   The patient's preventative maintenance and recommended screening tests for an annual wellness exam were reviewed in full today. Brought up to date unless services  declined.  Counselled on the importance of diet, exercise, and its role in overall health and mortality. The patient's FH and SH was reviewed, including their home life, tobacco status, and drug and alcohol status.   Vaccines: Uptodate with Tdap, shingles vaccine,PNA, flu shot.    Mammo:  05/2022 PAP/dve :  Pap not indicated. No DVE given no family history of ovarian uterine cancer Colon: No family history of colon cancer known.  Cologuard  01/2021 positive.. colonoscopy 02/2021, polyps repeat in 3 years  DEXA: 02/2019 normal.. Repeat in 5 years.  Hep C neg  Problem List Items Addressed This Visit     High cholesterol (Chronic)   Stable, chronic.  LDL now at goal less than 102.  continue current medication.  atorvastatin 10 mg daily.       HTN (hypertension) (Chronic)   Stable, chronic.  Continue current medication.   Amlodipine 10 mg daily  lisinopril HCTZ  20/12.5 mg daily      Morbid obesity (HCC)   Encouraged exercise, weight loss, healthy eating habits.       Prediabetes (Chronic)   Chronic stable control with lifestyle.      Other Visit Diagnoses       Routine general medical examination at a health care facility    -  Primary       No follow-ups on file.  Kerby Nora, MD

## 2023-04-16 ENCOUNTER — Other Ambulatory Visit: Payer: Self-pay | Admitting: Family Medicine

## 2023-04-16 DIAGNOSIS — Z1231 Encounter for screening mammogram for malignant neoplasm of breast: Secondary | ICD-10-CM

## 2023-05-19 ENCOUNTER — Other Ambulatory Visit: Payer: Self-pay | Admitting: Family Medicine

## 2023-05-23 ENCOUNTER — Other Ambulatory Visit: Payer: Self-pay | Admitting: Family Medicine

## 2023-06-03 DIAGNOSIS — H52223 Regular astigmatism, bilateral: Secondary | ICD-10-CM | POA: Diagnosis not present

## 2023-06-03 DIAGNOSIS — H524 Presbyopia: Secondary | ICD-10-CM | POA: Diagnosis not present

## 2023-06-03 DIAGNOSIS — H2513 Age-related nuclear cataract, bilateral: Secondary | ICD-10-CM | POA: Diagnosis not present

## 2023-06-03 DIAGNOSIS — H5213 Myopia, bilateral: Secondary | ICD-10-CM | POA: Diagnosis not present

## 2023-06-03 DIAGNOSIS — H0288A Meibomian gland dysfunction right eye, upper and lower eyelids: Secondary | ICD-10-CM | POA: Diagnosis not present

## 2023-06-03 DIAGNOSIS — H0288B Meibomian gland dysfunction left eye, upper and lower eyelids: Secondary | ICD-10-CM | POA: Diagnosis not present

## 2023-06-07 ENCOUNTER — Ambulatory Visit
Admission: RE | Admit: 2023-06-07 | Discharge: 2023-06-07 | Disposition: A | Discharge status: Home / Self Care | Source: Ambulatory Visit | Attending: Family Medicine | Admitting: Family Medicine

## 2023-06-07 DIAGNOSIS — Z1231 Encounter for screening mammogram for malignant neoplasm of breast: Secondary | ICD-10-CM | POA: Diagnosis not present

## 2023-06-12 ENCOUNTER — Ambulatory Visit: Payer: Self-pay | Admitting: Family Medicine

## 2023-10-30 ENCOUNTER — Ambulatory Visit: Payer: Medicare HMO

## 2023-10-30 VITALS — Ht 67.5 in | Wt 285.0 lb

## 2023-10-30 DIAGNOSIS — Z0000 Encounter for general adult medical examination without abnormal findings: Secondary | ICD-10-CM | POA: Diagnosis not present

## 2023-10-30 NOTE — Patient Instructions (Signed)
 Cassie Nelson,  Thank you for taking the time for your Medicare Wellness Visit. I appreciate your continued commitment to your health goals. Please review the care plan we discussed, and feel free to reach out if I can assist you further.  Medicare recommends these wellness visits once per year to help you and your care team stay ahead of potential health issues. These visits are designed to focus on prevention, allowing your provider to concentrate on managing your acute and chronic conditions during your regular appointments.  Please note that Annual Wellness Visits do not include a physical exam. Some assessments may be limited, especially if the visit was conducted virtually. If needed, we may recommend a separate in-person follow-up with your provider.  Ongoing Care Seeing your primary care provider every 3 to 6 months helps us  monitor your health and provide consistent, personalized care.   Referrals If a referral was made during today's visit and you haven't received any updates within two weeks, please contact the referred provider directly to check on the status.  Recommended Screenings:  Health Maintenance  Topic Date Due   DEXA scan (bone density measurement)  02/27/2024   Colon Cancer Screening  03/08/2024   COVID-19 Vaccine (11 - 2025-26 season) 04/17/2024   Breast Cancer Screening  06/06/2024   Medicare Annual Wellness Visit  10/29/2024   DTaP/Tdap/Td vaccine (3 - Td or Tdap) 02/24/2033   Pneumococcal Vaccine for age over 37  Completed   Flu Shot  Completed   Hepatitis C Screening  Completed   Zoster (Shingles) Vaccine  Completed   Meningitis B Vaccine  Aged Out   Cologuard (Stool DNA test)  Discontinued       10/29/2022    3:06 PM  Advanced Directives  Does Patient Have a Medical Advance Directive? Yes  Type of Estate agent of [place];Living will  Does patient want to make changes to medical advance directive? No - Patient declined  Copy of  Healthcare Power of Attorney in Chart? Yes - validated most recent copy scanned in chart (See row information)   Advance Care Planning is important because it: Ensures you receive medical care that aligns with your values, goals, and preferences. Provides guidance to your family and loved ones, reducing the emotional burden of decision-making during critical moments.  Vision: Annual vision screenings are recommended for early detection of glaucoma, cataracts, and diabetic retinopathy. These exams can also reveal signs of chronic conditions such as diabetes and high blood pressure.  Dental: Annual dental screenings help detect early signs of oral cancer, gum disease, and other conditions linked to overall health, including heart disease and diabetes.

## 2023-10-30 NOTE — Progress Notes (Signed)
 Please attest and cosign this visit due to patients primary care provider not being in the office at the time the visit was completed.    Subjective:   Cassie Nelson is a 73 y.o. who presents for a Medicare Wellness preventive visit.  As a reminder, Annual Wellness Visits don't include a physical exam, and some assessments may be limited, especially if this visit is performed virtually. We may recommend an in-person follow-up visit with your provider if needed.  Visit Complete: Virtual I connected with  Cassie Nelson on 10/30/23 by a audio enabled telemedicine application and verified that I am speaking with the correct person using two identifiers.  Patient Location: Home  Provider Location: Office/Clinic  I discussed the limitations of evaluation and management by telemedicine. The patient expressed understanding and agreed to proceed.  Vital Signs: Because this visit was a virtual/telehealth visit, some criteria may be missing or patient reported. Any vitals not documented were not able to be obtained and vitals that have been documented are patient reported.  VideoDeclined- This patient declined Librarian, academic. Therefore the visit was completed with audio only.  Persons Participating in Visit: Patient.  AWV Questionnaire: Yes: Patient Medicare AWV questionnaire was completed by the patient on 10/28/23; I have confirmed that all information answered by patient is correct and no changes since this date.  Cardiac Risk Factors include: advanced age (>43men, >35 women);dyslipidemia;hypertension;obesity (BMI >30kg/m2)     Objective:    Today's Vitals   10/30/23 1301  Weight: 285 lb (129.3 kg)  Height: 5' 7.5 (1.715 m)   Body mass index is 43.98 kg/m.     10/30/2023    1:07 PM 10/29/2022    3:06 PM 12/15/2021   10:40 AM 03/08/2021    6:54 AM 12/14/2020   11:19 AM 12/17/2018    6:42 AM 12/15/2018    9:35 AM  Advanced Directives   Does Patient Have a Medical Advance Directive? Yes Yes Yes Yes Yes Yes Yes  Type of Estate agent of [place];Living will Healthcare Power of [place];Living will Healthcare Power of [place];Living will Living will Healthcare Power of [place];Living will Healthcare Power of [place];Living will Healthcare Power of [place];Living will  Does patient want to make changes to medical advance directive?  No - Patient declined No - Patient declined  Yes (MAU/Ambulatory/Procedural Areas - Information given)  No - Patient declined  Copy of Healthcare Power of Attorney in Chart? Yes - validated most recent copy scanned in chart (See row information) Yes - validated most recent copy scanned in chart (See row information) Yes - validated most recent copy scanned in chart (See row information)  Yes - validated most recent copy scanned in chart (See row information) Yes - validated most recent copy scanned in chart (See row information) No - copy requested    Current Medications (verified) Outpatient Encounter Medications as of 10/30/2023  Medication Sig   amLODipine  (NORVASC ) 10 MG tablet TAKE 1 TABLET BY MOUTH EVERY DAY   amoxicillin (AMOXIL) 500 MG capsule Take 2,000 mg by mouth. 1 to 2 hours prior to dental work   atorvastatin  (LIPITOR) 10 MG tablet TAKE 1 TABLET BY MOUTH EVERY DAY   CALCIUM -VITAMIN D PO Take 600 mg by mouth daily.    lisinopril -hydrochlorothiazide  (ZESTORETIC ) 20-12.5 MG tablet TAKE 1 TABLET BY MOUTH EVERY DAY   Multiple Vitamin (MULTIVITAMIN) tablet Take 1 tablet by mouth daily. Centrum Silver   Multiple Vitamins-Minerals (HAIR VITAMINS) TABS Take 2  tablets by mouth at bedtime. Keranique KeraViatin Hair & Scalp Health Supplements or [employer] Healthy Hair Vitamin   colchicine  0.6 MG tablet TAKE 1 TABLET BY MOUTH 2 TIMES DAILY. (Patient not taking: Reported on 03/21/2023)   indomethacin  (INDOCIN ) 50 MG capsule Take 1 capsule (50 mg total) by mouth 3 (three)  times daily with meals. (Patient not taking: Reported on 03/21/2023)   No facility-administered encounter medications on file as of 10/30/2023.    Allergies (verified) Levofloxacin   History: Past Medical History:  Diagnosis Date   Arthritis    Diverticular disease    Hx of diverticulitis of colon 2009, 2010   Hypertension    Shortness of breath    on exertion   Past Surgical History:  Procedure Laterality Date   COLONOSCOPY WITH PROPOFOL  N/A 03/08/2021   Procedure: COLONOSCOPY WITH PROPOFOL ;  Surgeon: Therisa Bi, MD;  Location: [facility] ENDOSCOPY;  Service: Gastroenterology;  Laterality: N/A;   DILITATION & CURRETTAGE/HYSTROSCOPY WITH NOVASURE ABLATION N/A 07/03/2012   Procedure: DILATATION & CURETTAGE/HYSTEROSCOPY WITH NOVASURE ABLATION;  Surgeon: Elveria Mungo, MD;  Location: [facility] ORS;  Service: Gynecology;  Laterality: N/A;   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, surgery at 55 months old   JOINT REPLACEMENT  12/17/2018   Hip replacement   MOUTH SURGERY     biopsy of cheek   TONSILLECTOMY     TONSILLECTOMY     TOTAL HIP ARTHROPLASTY Right 12/17/2018   Procedure: TOTAL HIP ARTHROPLASTY ANTERIOR APPROACH;  Surgeon: Fidel Rogue, MD;  Location: [facility] ORS;  Service: Orthopedics;  Laterality: Right;   Family History  Problem Relation Age of Onset   Heart disease Father    Cancer Father 110       colon cancer   Arthritis Father    Hypertension Father    Hypothyroidism Father    Hypertension Mother    Hypothyroidism Mother    Breast cancer Neg Hx    Social History   Socioeconomic History   Marital status: Single    Spouse name: Not on file   Number of children: 0   Years of education: Not on file   Highest education level: Bachelor's degree (e.g., BA, AB, BS)  Occupational History   Occupation: Chief Technology Officer: [employer]  Tobacco Use   Smoking status: Former    Current packs/day: 0.00    Types: Cigarettes    Start date: 06/21/1985    Quit date: 06/22/2010     Years since quitting: 13.3   Smokeless tobacco: Never  Vaping Use   Vaping status: Never Used  Substance and Sexual Activity   Alcohol  use: Yes    Alcohol /week: 1.0 standard drink of alcohol     Types: 1 Standard drinks or equivalent per week    Comment: occasionally   Drug use: No   Sexual activity: Never  Other Topics Concern   Not on file  Social History Narrative   Single, No kids.   Works as a Human resources officer for [employer].   Social Drivers of [employer]: Low Risk  (10/28/2023)   Overall Financial Resource Strain (CARDIA)    Difficulty of Paying Living Expenses: Not hard at all  Food Insecurity: No Food Insecurity (10/28/2023)   Hunger Vital Sign    Worried About Running Out of Food in the Last Year: Never true    Ran Out of Food in the Last Year: Never true  Transportation Needs: No Transportation Needs (  10/28/2023)   PRAPARE - Administrator, Civil Service (Medical): No    Lack of Transportation (Non-Medical): No  Physical Activity: Sufficiently Active (10/28/2023)   Exercise Vital Sign    Days of Exercise per Week: 3 days    Minutes of Exercise per Session: 60 min  Stress: No Stress Concern Present (10/28/2023)   [employer] of Occupational Health - Occupational Stress Questionnaire    Feeling of Stress: Not at all  Social Connections: Moderately Isolated (10/28/2023)   Social Connection and Isolation Panel    Frequency of Communication with Friends and Family: More than three times a week    Frequency of Social Gatherings with Friends and Family: Twice a week    Attends Religious Services: Never    Database administrator or Organizations: Yes    Attends Engineer, structural: More than 4 times per year    Marital Status: Never married    Tobacco Counseling Counseling given: Not Answered   Clinical Intake:  Pre-visit preparation completed: Yes  Pain : No/denies pain    BMI - recorded:  43.98 Nutritional Status: BMI > 30  Obese Nutritional Risks: None Diabetes: No  Lab Results  Component Value Date   HGBA1C 6.0 03/19/2023   HGBA1C 5.6 06/05/2022   HGBA1C 5.6 02/14/2022     How often do you need to have someone help you when you read instructions, pamphlets, or other written materials from your doctor or pharmacy?: 1 - Never  Interpreter Needed?: No  Comments: lives alone Information entered by :: B.Tyjai Charbonnet,LPN   Activities of Daily Living     10/28/2023    5:53 AM  In your present state of health, do you have any difficulty performing the following activities:  Hearing? 0  Vision? 0  Difficulty concentrating or making decisions? 0  Walking or climbing stairs? 0  Dressing or bathing? 0  Doing errands, shopping? 0  Preparing Food and eating ? N  Using the Toilet? N  In the past six months, have you accidently leaked urine? Y  Do you have problems with loss of bowel control? N  Managing your Medications? N  Managing your Finances? N  Housekeeping or managing your Housekeeping? N    Patient Care Team: Avelina Greig BRAVO, MD as PCP - General (Family Medicine) Portia Fireman, OD (Optometry)  I have updated your Care Teams any recent Medical Services you may have received from other providers in the past year.     Assessment:   This is a routine wellness examination for Chace.  Hearing/Vision screen Hearing Screening - Comments:: Patient denies any hearing difficulties.   Vision Screening - Comments:: Pt says their vision is good with glasses Dr  Portia   Goals Addressed               This Visit's Progress     Exercise 3x per week (30 min per time)        10/30/23-works with personal trainer now      Weight loss goal 190lb (pt-stated)   On track     10/30/23- Would like to get back on track with weight loss. [facility] at [facility]       Depression Screen     10/30/2023    1:05 PM 03/21/2023   11:05 AM 10/29/2022    3:05 PM  06/21/2022    9:54 AM 02/21/2022   10:31 AM 12/15/2021   10:37 AM 12/14/2020   11:21 AM  PHQ  2/9 Scores  PHQ - 2 Score 0 0 0 0 0 0 0  PHQ- 9 Score    0 0      Fall Risk     10/28/2023    5:53 AM 03/21/2023   11:04 AM 10/29/2022    3:03 PM 10/25/2022    8:53 PM 06/21/2022    9:54 AM  Fall Risk   Falls in the past year? 0 0 0 0 0  Number falls in past yr: 0 0 0 0 0  Injury with Fall? 0 0 0 0 0  Risk for fall due to : No Fall Risks No Fall Risks No Fall Risks  No Fall Risks  Follow up Falls prevention discussed;Education provided Falls evaluation completed Falls prevention discussed  Falls evaluation completed    MEDICARE RISK AT HOME:  Medicare Risk at Home Any stairs in or around the home?: (Patient-Rptd) Yes If so, are there any without handrails?: (Patient-Rptd) No Home free of loose throw rugs in walkways, pet beds, electrical cords, etc?: (Patient-Rptd) Yes Adequate lighting in your home to reduce risk of falls?: (Patient-Rptd) Yes Life alert?: (Patient-Rptd) No Use of a cane, walker or w/c?: (Patient-Rptd) No Grab bars in the bathroom?: (Patient-Rptd) No Shower chair or bench in shower?: (Patient-Rptd) No Elevated toilet seat or a handicapped toilet?: (Patient-Rptd) Yes  TIMED UP AND GO:  Was the test performed?  No  Cognitive Function: 6CIT completed        10/30/2023    1:08 PM 10/29/2022    3:07 PM 12/15/2021   10:49 AM  6CIT Screen  What Year? 0 points 0 points 0 points  What month? 0 points 0 points 0 points  What time? 0 points 0 points 0 points  Count back from 20 0 points 0 points 0 points  Months in reverse 0 points 0 points 0 points  Repeat phrase 0 points 0 points 0 points  Total Score 0 points 0 points 0 points    Immunizations Immunization History  Administered Date(s) Administered   Fluad Quad(high Dose 65+) 10/28/2018, 10/16/2019, 10/18/2020, 10/20/2021   INFLUENZA, HIGH DOSE SEASONAL PF 10/20/2021, 10/17/2022, 10/18/2023   Influenza,inj,Quad  PF,6+ Mos 10/20/2013, 11/11/2014, 11/07/2015, 11/07/2016, 10/09/2017   Moderna Covid-19 Fall Seasonal Vaccine 31yrs & older 01/01/2022   PFIZER Comirnaty(Gray Top)Covid-19 Tri-Sucrose Vaccine 06/14/2020   PFIZER(Purple Top)SARS-COV-2 Vaccination 02/28/2019, 03/25/2019, 10/31/2019, 06/14/2020   Pfizer Covid-19 Vaccine Bivalent Booster 81yrs & up 10/18/2020, 06/02/2021   Pfizer(Comirnaty)Fall Seasonal Vaccine 12 years and older 10/20/2022, 10/18/2023   Pneumococcal Conjugate-13 03/09/2016   Pneumococcal Polysaccharide-23 07/12/2017   Respiratory Syncytial Virus Vaccine,Recomb Aduvanted(Arexvy) 10/20/2021   Tdap 05/22/2012, 02/25/2023   Zoster Recombinant(Shingrix) 02/08/2021, 05/26/2021   Zoster, Live 08/04/2013    Screening Tests Health Maintenance  Topic Date Due   DEXA SCAN  02/27/2024   Colonoscopy  03/08/2024   COVID-19 Vaccine (11 - 2025-26 season) 04/17/2024   Mammogram  06/06/2024   Medicare Annual Wellness (AWV)  10/29/2024   DTaP/Tdap/Td (3 - Td or Tdap) 02/24/2033   Pneumococcal Vaccine: 50+ Years  Completed   Influenza Vaccine  Completed   Hepatitis C Screening  Completed   Zoster Vaccines- Shingrix  Completed   Meningococcal B Vaccine  Aged Out   Fecal DNA (Cologuard)  Discontinued    Health Maintenance Items Addressed: None at this time  Additional Screening:  Vision Screening: Recommended annual ophthalmology exams for early detection of glaucoma and other disorders of the eye. Is the patient up to date with  their annual eye exam?  Yes  Who is the provider or what is the name of the office in which the patient attends annual eye exams? Dr Portia  Dental Screening: Recommended annual dental exams for proper oral hygiene  Community Resource Referral / Chronic Care Management: CRR required this visit?  No   CCM required this visit?  Appt scheduled with PCP   Plan:    I have personally reviewed and noted the following in the patient's chart:   Medical  and social history Use of alcohol , tobacco or illicit drugs  Current medications and supplements including opioid prescriptions. Patient is not currently taking opioid prescriptions. Functional ability and status Nutritional status Physical activity Advanced directives List of other physicians Hospitalizations, surgeries, and ER visits in previous 12 months Vitals Screenings to include cognitive, depression, and falls Referrals and appointments  In addition, I have reviewed and discussed with patient certain preventive protocols, quality metrics, and best practice recommendations. A written personalized care plan for preventive services as well as general preventive health recommendations were provided to patient.   Erminio LITTIE Saris, LPN   [id number]   After Visit Summary: (MyChart) Due to this being a telephonic visit, the after visit summary with patients personalized plan was offered to patient via MyChart   Notes: Nothing significant to report at this time.

## 2023-10-31 DIAGNOSIS — M1712 Unilateral primary osteoarthritis, left knee: Secondary | ICD-10-CM | POA: Diagnosis not present

## 2023-11-20 ENCOUNTER — Other Ambulatory Visit: Payer: Self-pay | Admitting: Family Medicine

## 2023-11-20 DIAGNOSIS — M1712 Unilateral primary osteoarthritis, left knee: Secondary | ICD-10-CM | POA: Diagnosis not present

## 2024-02-14 ENCOUNTER — Telehealth: Payer: Self-pay | Admitting: Family Medicine

## 2024-02-14 NOTE — Telephone Encounter (Signed)
 Copied from CRM #[id number]. Topic: Referral - Request for Referral >> Feb 14, 2024 11:15 AM Kevelyn M wrote: Did the patient discuss referral with their provider in the last year? No GI doctor moved to another facility and she needs a referral to start as a new patient  Appointment offered? No  Type of order/referral and detailed reason for visit: Colonoscopy  Preference of office, provider, location: [facility], Dr. Ruel Kung  If referral order, have you been seen by this specialty before? Yes (If Yes, this issue or another issue? When? Where?  Can we respond through MyChart? Yes, please let the patient know when the process is complete.

## 2024-04-01 ENCOUNTER — Other Ambulatory Visit

## 2024-04-08 ENCOUNTER — Encounter: Admitting: Family Medicine

## 2024-10-30 ENCOUNTER — Ambulatory Visit
